# Patient Record
Sex: Female | Born: 1975 | Race: White | Hispanic: No | Marital: Married | Smoking: Former smoker
Health system: Southern US, Community
[De-identification: ages and names within clinical notes are randomized; demographics above are authoritative.]

## PROBLEM LIST (undated history)

## (undated) DIAGNOSIS — E119 Type 2 diabetes mellitus without complications: Secondary | ICD-10-CM

## (undated) DIAGNOSIS — F419 Anxiety disorder, unspecified: Secondary | ICD-10-CM

## (undated) DIAGNOSIS — E669 Obesity, unspecified: Secondary | ICD-10-CM

## (undated) DIAGNOSIS — I499 Cardiac arrhythmia, unspecified: Secondary | ICD-10-CM

## (undated) DIAGNOSIS — I10 Essential (primary) hypertension: Secondary | ICD-10-CM

## (undated) HISTORY — PX: CHOLECYSTECTOMY: SHX55

---

## 2010-05-26 ENCOUNTER — Ambulatory Visit: Payer: Self-pay | Admitting: Family Medicine

## 2011-04-20 ENCOUNTER — Ambulatory Visit: Payer: Self-pay | Admitting: Internal Medicine

## 2011-04-27 ENCOUNTER — Ambulatory Visit: Payer: Self-pay | Admitting: Internal Medicine

## 2011-12-21 ENCOUNTER — Emergency Department: Payer: Self-pay | Admitting: Emergency Medicine

## 2011-12-21 ENCOUNTER — Ambulatory Visit: Payer: Self-pay | Admitting: Family Medicine

## 2011-12-21 LAB — TROPONIN I: Troponin-I: <0.02 ng/mL

## 2011-12-21 LAB — CK-MB: CK-MB: <0.5 ng/mL — ABNORMAL LOW (ref 0.5–3.6)

## 2011-12-21 LAB — CK: CK, Total: 77 U/L (ref 21–215)

## 2011-12-22 LAB — URINALYSIS, COMPLETE
Bacteria: NONE SEEN
Bilirubin,UR: NEGATIVE
Blood: NEGATIVE
Glucose,UR: NEGATIVE mg/dL (ref 0–75)
Hyaline Cast: 1
Ketone: NEGATIVE
Nitrite: NEGATIVE
Ph: 5 (ref 4.5–8.0)
Protein: NEGATIVE
RBC,UR: <1 /HPF (ref 0–5)
Specific Gravity: 1.026 (ref 1.003–1.030)
Squamous Epithelial: 4
WBC UR: <1 /HPF (ref 0–5)

## 2011-12-22 LAB — COMPREHENSIVE METABOLIC PANEL
Albumin: 3.6 g/dL (ref 3.4–5.0)
Alkaline Phosphatase: 85 U/L (ref 50–136)
Anion Gap: 12 (ref 7–16)
BUN: 20 mg/dL — ABNORMAL HIGH (ref 7–18)
Bilirubin,Total: 0.5 mg/dL (ref 0.2–1.0)
Calcium, Total: 8.7 mg/dL (ref 8.5–10.1)
Chloride: 100 mmol/L (ref 98–107)
Co2: 27 mmol/L (ref 21–32)
Creatinine: 1.05 mg/dL (ref 0.60–1.30)
EGFR (African American): >60
EGFR (Non-African Amer.): >60
Glucose: 109 mg/dL — ABNORMAL HIGH (ref 65–99)
Osmolality: 281 (ref 275–301)
Potassium: 3.2 mmol/L — ABNORMAL LOW (ref 3.5–5.1)
SGOT(AST): 20 U/L (ref 15–37)
SGPT (ALT): 19 U/L
Sodium: 139 mmol/L (ref 136–145)
Total Protein: 7.9 g/dL (ref 6.4–8.2)

## 2011-12-22 LAB — PREGNANCY, URINE: Pregnancy Test, Urine: NEGATIVE m[IU]/mL

## 2011-12-22 LAB — CBC
HCT: 35.9 % (ref 35.0–47.0)
HGB: 12.2 g/dL (ref 12.0–16.0)
MCH: 30.1 pg (ref 26.0–34.0)
MCHC: 34.1 g/dL (ref 32.0–36.0)
MCV: 88 fL (ref 80–100)
Platelet: 311 10*3/uL (ref 150–440)
RBC: 4.07 10*6/uL (ref 3.80–5.20)
RDW: 14 % (ref 11.5–14.5)
WBC: 9.2 10*3/uL (ref 3.6–11.0)

## 2011-12-22 LAB — HCG, QUANTITATIVE, PREGNANCY: Beta Hcg, Quant.: <1 m[IU]/mL — ABNORMAL LOW

## 2012-07-11 ENCOUNTER — Ambulatory Visit: Payer: Self-pay | Admitting: Internal Medicine

## 2013-04-09 ENCOUNTER — Ambulatory Visit: Payer: Self-pay | Admitting: Family Medicine

## 2014-01-16 ENCOUNTER — Ambulatory Visit: Payer: Self-pay | Admitting: Nurse Practitioner

## 2015-01-29 ENCOUNTER — Other Ambulatory Visit: Payer: Self-pay | Admitting: Nurse Practitioner

## 2015-01-29 DIAGNOSIS — R1906 Epigastric swelling, mass or lump: Secondary | ICD-10-CM

## 2015-01-30 ENCOUNTER — Other Ambulatory Visit: Payer: Self-pay | Admitting: Nurse Practitioner

## 2015-02-02 ENCOUNTER — Ambulatory Visit
Admission: RE | Admit: 2015-02-02 | Discharge: 2015-02-02 | Disposition: A | Discharge status: Home / Self Care | Payer: BLUE CROSS/BLUE SHIELD | Source: Ambulatory Visit | Attending: Nurse Practitioner | Admitting: Nurse Practitioner

## 2015-02-02 DIAGNOSIS — R1906 Epigastric swelling, mass or lump: Secondary | ICD-10-CM | POA: Diagnosis not present

## 2015-02-18 ENCOUNTER — Ambulatory Visit
Admission: EM | Admit: 2015-02-18 | Discharge: 2015-02-18 | Disposition: A | Discharge status: Home / Self Care | Payer: BLUE CROSS/BLUE SHIELD | Attending: Family Medicine | Admitting: Family Medicine

## 2015-02-18 ENCOUNTER — Encounter: Payer: Self-pay | Admitting: Gynecology

## 2015-02-18 DIAGNOSIS — J069 Acute upper respiratory infection, unspecified: Secondary | ICD-10-CM

## 2015-02-18 DIAGNOSIS — J029 Acute pharyngitis, unspecified: Secondary | ICD-10-CM | POA: Diagnosis not present

## 2015-02-18 HISTORY — DX: Essential (primary) hypertension: I10

## 2015-02-18 HISTORY — DX: Anxiety disorder, unspecified: F41.9

## 2015-02-18 LAB — RAPID STREP SCREEN (MED CTR MEBANE ONLY): Streptococcus, Group A Screen (Direct): NEGATIVE

## 2015-02-18 MED ORDER — AZITHROMYCIN 250 MG PO TABS: ORAL_TABLET | ORAL | Status: DC

## 2015-02-18 NOTE — ED Provider Notes (Signed)
Patient presents with symptoms of sore throat, productive cough, nasal congestion for the last 3 days. Patient admits to low-grade fever. She denies any chest pain, shortness of breath, severe headache, nausea, vomiting, diarrhea, abdominal pain. She has been taking ibuprofen for her symptoms. She states that she works with a lot of people at [employer] that could be sick.  ROS: Negative except mentioned above. Vitals as per Epic.   GENERAL: NAD HEENT: mild to moderate pharyngeal erythema, no exudate, no erythema of TMs, mild cervical LAD RESP: CTA B CARD: RRR NEURO: CN II-XII grossly intact   A/P: Pharyngitis/URI- Z-pk, Delsym, Claritin, Tylenol/Motrin when necessary, rest, hydration, seek medical attention if symptoms persist or worsen.   Jolene ProvostKirtida Sonya Pucci, MD 02/18/15 1323

## 2015-02-18 NOTE — ED Notes (Signed)
Patient c/o sore throat / sinus infection. Per pt. X 3 days painful to swallow/ ear pain / facial congestion.

## 2015-02-18 NOTE — ED Notes (Signed)
Pt given verbal instructions for discharge via Dr.Patel. Pt was ambulatory leaving the clinic

## 2015-02-21 LAB — CULTURE, GROUP A STREP (THRC)

## 2015-05-01 ENCOUNTER — Emergency Department
Admission: EM | Admit: 2015-05-01 | Discharge: 2015-05-01 | Disposition: A | Discharge status: Home / Self Care | Payer: BLUE CROSS/BLUE SHIELD | Attending: Emergency Medicine | Admitting: Emergency Medicine

## 2015-05-01 ENCOUNTER — Emergency Department: Payer: BLUE CROSS/BLUE SHIELD

## 2015-05-01 DIAGNOSIS — Z3202 Encounter for pregnancy test, result negative: Secondary | ICD-10-CM | POA: Insufficient documentation

## 2015-05-01 DIAGNOSIS — Z79899 Other long term (current) drug therapy: Secondary | ICD-10-CM | POA: Insufficient documentation

## 2015-05-01 DIAGNOSIS — Z792 Long term (current) use of antibiotics: Secondary | ICD-10-CM | POA: Diagnosis not present

## 2015-05-01 DIAGNOSIS — I10 Essential (primary) hypertension: Secondary | ICD-10-CM | POA: Diagnosis not present

## 2015-05-01 DIAGNOSIS — M25551 Pain in right hip: Secondary | ICD-10-CM | POA: Diagnosis not present

## 2015-05-01 LAB — POC URINE PREG, ED: Urine-Other: NEGATIVE

## 2015-05-01 MED ORDER — KETOROLAC TROMETHAMINE 60 MG/2ML IM SOLN
60.0000 mg | Freq: Once | INTRAMUSCULAR | Status: AC
  Administered 2015-05-01: 60 mg via INTRAMUSCULAR

## 2015-05-01 MED ORDER — OXYCODONE-ACETAMINOPHEN 5-325 MG PO TABS: 1.0000 | ORAL_TABLET | ORAL | Status: DC | PRN

## 2015-05-01 MED ORDER — OXYCODONE-ACETAMINOPHEN 5-325 MG PO TABS
ORAL_TABLET | ORAL | Status: AC
  Administered 2015-05-01: 2 via ORAL
  Filled 2015-05-01: qty 1

## 2015-05-01 MED ORDER — OXYCODONE-ACETAMINOPHEN 5-325 MG PO TABS
ORAL_TABLET | ORAL | Status: AC
  Filled 2015-05-01: qty 1

## 2015-05-01 MED ORDER — OXYCODONE-ACETAMINOPHEN 5-325 MG PO TABS
2.0000 | ORAL_TABLET | Freq: Once | ORAL | Status: AC
  Administered 2015-05-01: 2 via ORAL

## 2015-05-01 MED ORDER — KETOROLAC TROMETHAMINE 60 MG/2ML IM SOLN
INTRAMUSCULAR | Status: AC
Start: 2015-05-01 — End: 2015-05-01
  Administered 2015-05-01: 60 mg via INTRAMUSCULAR
  Filled 2015-05-01: qty 2

## 2015-05-01 NOTE — Discharge Instructions (Signed)
Hip Pain Your hip is the joint between your upper legs and your lower pelvis. The bones, cartilage, tendons, and muscles of your hip joint perform a lot of work each day supporting your body weight and allowing you to move around. Hip pain can range from a minor ache to severe pain in one or both of your hips. Pain may be felt on the inside of the hip joint near the groin, or the outside near the buttocks and upper thigh. You may have swelling or stiffness as well.  HOME CARE INSTRUCTIONS   Take medicines only as directed by your health care provider.  Apply ice to the injured area:  Put ice in a plastic bag.  Place a towel between your skin and the bag.  Leave the ice on for 15-20 minutes at a time, 3-4 times a day.  Keep your leg raised (elevated) when possible to lessen swelling.  Avoid activities that cause pain.  Follow specific exercises as directed by your health care provider.  Sleep with a pillow between your legs on your most comfortable side.  Record how often you have hip pain, the location of the pain, and what it feels like. SEEK MEDICAL CARE IF:   You are unable to put weight on your leg.  Your hip is red or swollen or very tender to touch.  Your pain or swelling continues or worsens after 1 week.  You have increasing difficulty walking.  You have a fever. SEEK IMMEDIATE MEDICAL CARE IF:   You have fallen.  You have a sudden increase in pain and swelling in your hip. MAKE SURE YOU:   Understand these instructions.  Will watch your condition.  Will get help right away if you are not doing well or get worse. Document Released: 01/19/2010 Document Revised: 12/16/2013 Document Reviewed: 03/28/2013 Exit[employer] Patient Information 2015 [employer]. This information is not intended to replace advice given to you by your health care provider. Make sure you discuss any questions you have with your health care provider.  

## 2015-05-01 NOTE — ED Notes (Signed)
Pt to ED c/o R hip pain.

## 2015-05-01 NOTE — ED Provider Notes (Signed)
[facility] Emergency Department Provider Note  ____________________________________________  Time seen: 4:15 AM  I have reviewed the triage vital signs and the nursing notes.   HISTORY  Chief Complaint Hip Pain      HPI Lindsey Peterson is a 39 y.o. female presents with complaint of nontraumatic right hip pain. Patient states that she spent a lot of time yesterday going up and down a ladder and started experiencing right hip pain last night. It is worse with ambulation    Past Medical History  Diagnosis Date  . Hypertension   . Anxiety     There are no active problems to display for this patient.   Past Surgical History  Procedure Laterality Date  . Cesarean section    . Cholecystectomy      Current Outpatient Rx  Name  Route  Sig  Dispense  Refill  . azithromycin (ZITHROMAX Z-PAK) 250 MG tablet      Use as directed for 5 days.   6 each   0   . hydrochlorothiazide (HYDRODIURIL) 50 MG tablet   Oral   Take 50 mg by mouth daily.         Marland Kitchen oxyCODONE-acetaminophen (PERCOCET/ROXICET) 5-325 MG per tablet   Oral   Take 1 tablet by mouth every 4 (four) hours as needed for severe pain.   15 tablet   0   . PARoxetine (PAXIL) 40 MG tablet   Oral   Take 40 mg by mouth every morning.         . Vitamin D, Cholecalciferol, 1000 UNITS TABS   Oral   Take by mouth 2 (two) times daily.           Allergies Sulfa antibiotics  No family history on file.  Social History Social History  Substance Use Topics  . Smoking status: Never Smoker   . Smokeless tobacco: Not on file  . Alcohol Use: No    Review of Systems  Constitutional: Negative for fever. Eyes: Negative for visual changes. ENT: Negative for sore throat. Cardiovascular: Negative for chest pain. Respiratory: Negative for shortness of breath. Gastrointestinal: Negative for abdominal pain, vomiting and diarrhea. Genitourinary: Negative for dysuria. Musculoskeletal:  Positive for right hip pain Skin: Negative for rash. Neurological: Negative for headaches, focal weakness or numbness.   10-point ROS otherwise negative.  ____________________________________________   PHYSICAL EXAM:  VITAL SIGNS: ED Triage Vitals  Enc Vitals Group     BP 05/01/15 0349 171/99 mmHg     Pulse Rate 05/01/15 0349 98     Resp 05/01/15 0349 18     Temp 05/01/15 0349 98.4 F (36.9 C)     Temp Source 05/01/15 0349 Oral     SpO2 05/01/15 0349 95 %     Weight 05/01/15 0349 340 lb (154.223 kg)     Height 05/01/15 0349 5\' 7"  (1.702 m)     Head Cir --      Peak Flow --      Pain Score 05/01/15 0350 10     Pain Loc --      Pain Edu? --      Excl. in GC? --      Constitutional: Alert and oriented. Well appearing and in no distress. Eyes: Conjunctivae are normal. PERRL. Normal extraocular movements. ENT   Head: Normocephalic and atraumatic.   Nose: No congestion/rhinnorhea.   Mouth/Throat: Mucous membranes are moist.   Neck: No stridor. Hematological/Lymphatic/Immunilogical: No cervical lymphadenopathy. Cardiovascular: Normal rate, regular rhythm. Normal  and symmetric distal pulses are present in all extremities. No murmurs, rubs, or gallops. Respiratory: Normal respiratory effort without tachypnea nor retractions. Breath sounds are clear and equal bilaterally. No wheezes/rales/rhonchi. Gastrointestinal: Soft and nontender. No distention. There is no CVA tenderness. Genitourinary: deferred Musculoskeletal: Tenderness to palpation of the right hip.. No joint effusions.  No lower extremity tenderness nor edema. Neurologic:  Normal speech and language. No gross focal neurologic deficits are appreciated. Speech is normal.  Skin:  Skin is warm, dry and intact. No rash noted. Psychiatric: Mood and affect are normal. Speech and behavior are normal. Patient exhibits appropriate insight and judgment.  ____________________________________________    LABS  (pertinent positives/negatives)  Labs Reviewed  POC URINE PREG, ED       RADIOLOGY     DG HIP UNILAT WITH PELVIS 2-3 VIEWS RIGHT (Final result) Result time: 05/01/15 05:39:13   Final result by Rad Results In Interface (05/01/15 05:39:13)   Narrative:   CLINICAL DATA: Right hip pain for 1 day. No known injury.  EXAM: DG HIP (WITH OR WITHOUT PELVIS) 2-3V RIGHT  COMPARISON: None.  FINDINGS: The cortical margins of the bony pelvis and right hip are intact. No fracture. Pubic symphysis and sacroiliac joints are congruent. Both femoral heads are well-seated in the respective acetabula. Small subcortical cyst at the right femoral head neck junction, likely a synovial herniation pit of no clinical significance. Intrauterine device in the pelvis. Detailed evaluation limited by soft tissue attenuation from obese body habitus.  IMPRESSION: No acute bony abnormality.   Electronically Signed By: Rubye Oaks M.D. On: 05/01/2015 05:39         INITIAL IMPRESSION / ASSESSMENT AND PLAN / ED COURSE  Pertinent labs & imaging results that were available during my care of the patient were reviewed by me and considered in my medical decision making (see chart for details).  Patient received IM Toradol and Percocet with improvement of pain. Patient advised to follow-up with orthopedic surgeon on call.  ____________________________________________   FINAL CLINICAL IMPRESSION(S) / ED DIAGNOSES  Final diagnoses:  Right hip pain      Darci Current, MD 05/05/15 [id number]

## 2015-05-01 NOTE — ED Notes (Signed)
POC PREGNANCY TEST NEGATIVE 

## 2015-06-01 ENCOUNTER — Encounter: Payer: Self-pay | Admitting: Emergency Medicine

## 2015-06-01 ENCOUNTER — Ambulatory Visit: Payer: BLUE CROSS/BLUE SHIELD

## 2015-06-01 ENCOUNTER — Ambulatory Visit
Admission: EM | Admit: 2015-06-01 | Discharge: 2015-06-01 | Disposition: A | Discharge status: Home / Self Care | Payer: BLUE CROSS/BLUE SHIELD | Attending: Family Medicine | Admitting: Family Medicine

## 2015-06-01 DIAGNOSIS — F419 Anxiety disorder, unspecified: Secondary | ICD-10-CM | POA: Diagnosis not present

## 2015-06-01 DIAGNOSIS — S29011A Strain of muscle and tendon of front wall of thorax, initial encounter: Secondary | ICD-10-CM | POA: Diagnosis not present

## 2015-06-01 DIAGNOSIS — Z87891 Personal history of nicotine dependence: Secondary | ICD-10-CM | POA: Diagnosis not present

## 2015-06-01 DIAGNOSIS — I10 Essential (primary) hypertension: Secondary | ICD-10-CM | POA: Insufficient documentation

## 2015-06-01 DIAGNOSIS — X58XXXA Exposure to other specified factors, initial encounter: Secondary | ICD-10-CM | POA: Insufficient documentation

## 2015-06-01 DIAGNOSIS — R0781 Pleurodynia: Secondary | ICD-10-CM | POA: Insufficient documentation

## 2015-06-01 DIAGNOSIS — M549 Dorsalgia, unspecified: Secondary | ICD-10-CM | POA: Diagnosis present

## 2015-06-01 MED ORDER — OXYCODONE-ACETAMINOPHEN 7.5-325 MG PO TABS: 1.0000 | ORAL_TABLET | ORAL | Status: DC | PRN

## 2015-06-01 MED ORDER — KETOROLAC TROMETHAMINE 60 MG/2ML IM SOLN
60.0000 mg | Freq: Once | INTRAMUSCULAR | Status: AC
  Administered 2015-06-01: 60 mg via INTRAMUSCULAR

## 2015-06-01 MED ORDER — ORPHENADRINE CITRATE ER 100 MG PO TB12: 100.0000 mg | ORAL_TABLET | Freq: Two times a day (BID) | ORAL | Status: DC

## 2015-06-01 NOTE — ED Provider Notes (Signed)
CSN: [id number]     Arrival date & time 06/01/15  0815 History   First MD Initiated Contact with Patient 06/01/15 [phone]    Nurses notes were reviewed. Chief Complaint  Patient presents with  . Back Pain   patient reports having left-sided back pain and side pain started on Friday. She states the pain actually did radiate down her left arm at times the pain does occur at rest. She is asked taking meloxicam now 15 mg for hip arthritis and talk to her supervisor who had pleurisy before. While she's never had pneumonia or any other lung problems thought that she probably should come in to be evaluated. (Consider location/radiation/quality/duration/timing/severity/associated sxs/prior Treatment) Patient is a 39 y.o. female presenting with back pain and chest pain. The history is provided by the patient. No language interpreter was used.  Back Pain Location:  Thoracic spine Quality:  Aching and stabbing Radiates to:  L shoulder Pain severity:  Moderate Pain is:  Unable to specify Onset quality: Started on Friday. Timing:  Intermittent Progression:  Worsening Chronicity:  New Context: recent injury and twisting   Context: not emotional stress, not falling, not jumping from heights, not lifting heavy objects, not MCA and not occupational injury   Relieved by:  Nothing Ineffective treatments:  NSAIDs (Meloxicam 15 mg) Associated symptoms: chest pain and tingling   Associated symptoms: no weight loss   Risk factors: steroid use   Chest Pain Pain location:  L lateral chest Pain quality: aching, sharp and shooting   Pain radiates to:  L arm and L shoulder Pain radiates to the back: yes   Pain severity:  Moderate Duration:  4 days Timing:  Intermittent Progression:  Worsening Context: breathing, lifting, movement and at rest   Context: no stress and no trauma   Relieved by:  Nothing Ineffective treatments: Mobic 15mg . Associated symptoms: back pain   Associated symptoms: no shortness  of breath   Risk factors: obesity and smoking   Risk factors: no coronary artery disease and no diabetes mellitus   Risk factors comment:  Patient is a former smoker   Past Medical History  Diagnosis Date  . Hypertension   . Anxiety    Past Surgical History  Procedure Laterality Date  . Cesarean section    . Cholecystectomy     History reviewed. No pertinent family history. Social History  Substance Use Topics  . Smoking status: Former Games developermoker  . Smokeless tobacco: None  . Alcohol Use: No   OB History    No data available     Review of Systems  Constitutional: Positive for activity change. Negative for weight loss and appetite change.  Respiratory: Negative for shortness of breath and wheezing.   Cardiovascular: Positive for chest pain.  Musculoskeletal: Positive for myalgias and back pain.       States that she is being treated for arthritis of the right hip with meloxicam and meloxicam was more effective than the Percocet they initially placed her on in the ED.  Neurological: Positive for tingling.  All other systems reviewed and are negative.   Allergies  Sulfa antibiotics  Home Medications   Prior to Admission medications   Medication Sig Start Date End Date Taking? Authorizing Provider  levonorgestrel (MIRENA, 52 MG,) 20 MCG/24HR IUD 1 each by Intrauterine route once.   Yes Historical Provider, MD  meloxicam (MOBIC) 15 MG tablet Take 15 mg by mouth daily.   Yes Historical Provider, MD  azithromycin (ZITHROMAX Z-PAK) 250  MG tablet Use as directed for 5 days. 02/18/15   Jolene Provost, MD  hydrochlorothiazide (HYDRODIURIL) 50 MG tablet Take 50 mg by mouth daily.    Historical Provider, MD  orphenadrine (NORFLEX) 100 MG tablet Take 1 tablet (100 mg total) by mouth 2 (two) times daily. 06/01/15   Hassan Rowan, MD  oxyCODONE-acetaminophen (PERCOCET) 7.5-325 MG tablet Take 1 tablet by mouth every 4 (four) hours as needed for severe pain. 06/01/15   Hassan Rowan, MD    oxyCODONE-acetaminophen (PERCOCET/ROXICET) 5-325 MG per tablet Take 1 tablet by mouth every 4 (four) hours as needed for severe pain. 05/01/15   Darci Current, MD  PARoxetine (PAXIL) 40 MG tablet Take 40 mg by mouth every morning.    Historical Provider, MD  Vitamin D, Cholecalciferol, 1000 UNITS TABS Take by mouth 2 (two) times daily.    Historical Provider, MD   Meds Ordered and Administered this Visit  Nurses notes were reviewed. Medications  ketorolac (TORADOL) injection 60 mg (60 mg Intramuscular Given 06/01/15 0908)    BP 128/84 mmHg  Pulse 81  Temp(Src) 97.6 F (36.4 C) (Tympanic)  Resp 16  Ht  (1.702 m)  Wt 340 lb (154.223 kg)  BMI 53.24 kg/m2  SpO2 98%  LMP 05/16/2015 No data found.   Physical Exam  Constitutional: She is oriented to person, place, and time. She appears well-developed and well-nourished.  HENT:  Head: Normocephalic and atraumatic.  Eyes: Conjunctivae are normal. Pupils are equal, round, and reactive to light.  Neck: Normal range of motion. Neck supple.  Cardiovascular: Normal rate and regular rhythm.   Pulmonary/Chest: Effort normal and breath sounds normal. No respiratory distress. She has no wheezes.  Musculoskeletal: Normal range of motion. She exhibits tenderness.       Cervical back: She exhibits tenderness.       Thoracic back: She exhibits tenderness and bony tenderness.       Back:       Arms: Neurological: She is alert and oriented to person, place, and time.  Skin: Skin is warm. No rash noted. No erythema.  Psychiatric: She has a normal mood and affect.  Vitals reviewed.   ED Course  Procedures (including critical care time)  Labs Review Labs Reviewed - No data to display  Imaging Review Dg Ribs Unilateral W/chest Left  06/01/2015  CLINICAL DATA:  Anterior left mid rib pain 3 days prior. No reported injury . EXAM: LEFT RIBS AND CHEST - 3+ VIEW COMPARISON:  12/21/2011 chest radiograph. FINDINGS: Normal heart size. Normal  mediastinal contour. No pneumothorax. No pleural effusion. Clear lungs, with no focal lung consolidation and no pulmonary edema. No fracture or suspicious focal osseous lesion is seen in the left ribs. Cholecystectomy clips are noted in the right upper quadrant of the abdomen. IMPRESSION: No active disease in the chest. No fracture or suspicious focal osseous lesion is seen in the left ribs. Electronically Signed   By: Delbert Phenix M.D.   On: 06/01/2015 09:37     Visual Acuity Review  Right Eye Distance:   Left Eye Distance:   Bilateral Distance:    Right Eye Near:   Left Eye Near:    Bilateral Near:     ED ECG REPORT   Date: 06/01/2015  EKG Time: 11:11 AM  Rate:  Rhythm: normal sinus rhythm,  normal EKG, normal sinus rhythm, unchanged from previous tracings  Axis: 60  Intervals:none  ST&T Change: none  Narrative Interpretation: normal sinus rhythm  MDM   1. Muscle strain of chest wall, initial encounter   2. Rib pain on left side      While able to reproduce the pain and discomfort over the left axillary ribs and some palpation of the ribs in the back concern is because of her size and fact that she's used Mobic already and hasn't helped and has no history of trauma will obtain a chest x-ray and EKG those things are normal as expected we'll administer 60 Toradol IM and a muscle relaxer like on Norflex and may consider adding and narcotic pain medication as well.  Patient reported no improvement with Toradol injection. She is ready on meloxicam 15 mg at home. Percocet 5 were ineffective when she had a hip pain. We'll place on Percocet 7.5 #20, Norflex 100 mg twice a day and work note for today and tomorrow. If not improved please see her PCP in about a week.  Hassan Rowan, MD 06/01/15 [phone]

## 2015-06-01 NOTE — Discharge Instructions (Signed)
Costochondritis Costochondritis is a condition in which the tissue (cartilage) that connects your ribs with your breastbone (sternum) becomes irritated. It causes pain in the chest and rib area. It usually goes away on its own over time. HOME CARE  Avoid activities that wear you out.  Do not strain your ribs. Avoid activities that use your:  Chest.  Belly.  Side muscles.  Put ice on the area for the first 2 days after the pain starts.  Put ice in a plastic bag.  Place a towel between your skin and the bag.  Leave the ice on for 20 minutes, 2-3 times a day.  Only take medicine as told by your doctor. GET HELP IF:  You have redness or puffiness (swelling) in the rib area.  Your pain does not go away with rest or medicine. GET HELP RIGHT AWAY IF:   Your pain gets worse.  You are very uncomfortable.  You have trouble breathing.  You cough up blood.  You start sweating or throwing up (vomiting).  You have a fever or lasting symptoms for more than 2-3 days.  You have a fever and your symptoms suddenly get worse. MAKE SURE YOU:   Understand these instructions.  Will watch your condition.  Will get help right away if you are not doing well or get worse.   This information is not intended to replace advice given to you by your health care provider. Make sure you discuss any questions you have with your health care provider.   Document Released: 01/18/2008 Document Revised: 04/03/2013 Document Reviewed: 03/05/2013 Elsevier Interactive Patient Education 2016 [employer]  Muscle Strain A muscle strain (pulled muscle) happens when a muscle is stretched beyond normal length. It happens when a sudden, violent force stretches your muscle too far. Usually, a few of the fibers in your muscle are torn. Muscle strain is common in athletes. Recovery usually takes 1-2 weeks. Complete healing takes 5-6 weeks.  HOME CARE   Follow the PRICE method of treatment to help your  injury get better. Do this the first 2-3 days after the injury:  Protect. Protect the muscle to keep it from getting injured again.  Rest. Limit your activity and rest the injured body part.  Ice. Put ice in a plastic bag. Place a towel between your skin and the bag. Then, apply the ice and leave it on from 15-20 minutes each hour. After the third day, switch to moist heat packs.  Compression. Use a splint or elastic bandage on the injured area for comfort. Do not put it on too tightly.  Elevate. Keep the injured body part above the level of your heart.  Only take medicine as told by your doctor.  Warm up before doing exercise to prevent future muscle strains. GET HELP IF:   You have more pain or puffiness (swelling) in the injured area.  You feel numbness, tingling, or notice a loss of strength in the injured area. MAKE SURE YOU:   Understand these instructions.  Will watch your condition.  Will get help right away if you are not doing well or get worse.   This information is not intended to replace advice given to you by your health care provider. Make sure you discuss any questions you have with your health care provider.   Document Released: 05/10/2008 Document Revised: 05/22/2013 Document Reviewed: 02/28/2013 Elsevier Interactive Patient Education [employer]

## 2015-06-01 NOTE — ED Notes (Signed)
Patient c/o pain in her left upper back for 3 days.  Patient reports increase pain with movement and when taking a deep breath.

## 2015-08-17 ENCOUNTER — Ambulatory Visit
Admission: EM | Admit: 2015-08-17 | Discharge: 2015-08-17 | Disposition: A | Discharge status: Home / Self Care | Payer: BLUE CROSS/BLUE SHIELD | Attending: Family Medicine | Admitting: Family Medicine

## 2015-08-17 DIAGNOSIS — R600 Localized edema: Secondary | ICD-10-CM | POA: Diagnosis not present

## 2015-08-17 DIAGNOSIS — F419 Anxiety disorder, unspecified: Secondary | ICD-10-CM | POA: Diagnosis not present

## 2015-08-17 LAB — BASIC METABOLIC PANEL
Anion gap: 7 (ref 5–15)
BUN: 20 mg/dL (ref 6–20)
CO2: 25 mmol/L (ref 22–32)
Calcium: 9.3 mg/dL (ref 8.9–10.3)
Chloride: 104 mmol/L (ref 101–111)
Creatinine, Ser: 0.76 mg/dL (ref 0.44–1.00)
GFR calc Af Amer: >60 mL/min (ref >60)
GFR calc non Af Amer: >60 mL/min (ref >60)
Glucose, Bld: 99 mg/dL (ref 65–99)
Potassium: 3.6 mmol/L (ref 3.5–5.1)
Sodium: 136 mmol/L (ref 135–145)

## 2015-08-17 MED ORDER — HYDROCHLOROTHIAZIDE 50 MG PO TABS: 50.0000 mg | ORAL_TABLET | Freq: Every day | ORAL | Status: AC

## 2015-08-17 MED ORDER — PAROXETINE HCL 40 MG PO TABS: 40.0000 mg | ORAL_TABLET | ORAL | Status: DC

## 2015-08-17 NOTE — ED Notes (Signed)
Patient states that she does not have a primary care MD and she has been trying to get in with one. Patient states that she is on HCTZ and has been off of medication for the last 4 days. She states that she is swollen in hands and feet and this is causing numbness in her hands.

## 2015-08-17 NOTE — ED Provider Notes (Signed)
CSN: [id number]     Arrival date & time 08/17/15  1522 History   First MD Initiated Contact with Patient 08/17/15 1814     Chief Complaint  Patient presents with  . Numbness   (Consider location/radiation/quality/duration/timing/severity/associated sxs/prior Treatment) HPI Comments: 40 yo female with a h/o hypertension and anxiety complains of 4 days of  hand and feet swelling since running out of her hydrochlorothiazide which she's states she's prescribed by her gynecologist for her edema. States has had similar symptoms in the past when has been out of her HCTZ. Patient requesting a refill on her HCTZ and her Paxil. States both of these medicines have been prescribed by her gynecologist for the last 6 months and was told by gynecologist that she needed to find a PCP to prescribe this. Patient has not done this and now has run out of her HCTZ and per patient her gynecologist will not refill anymore.   The history is provided by the patient.    Past Medical History  Diagnosis Date  . Hypertension   . Anxiety    Past Surgical History  Procedure Laterality Date  . Cesarean section    . Cholecystectomy     History reviewed. No pertinent family history. Social History  Substance Use Topics  . Smoking status: Former Games developer  . Smokeless tobacco: None  . Alcohol Use: No   OB History    No data available     Review of Systems  Allergies  Sulfa antibiotics  Home Medications   Prior to Admission medications   Medication Sig Start Date End Date Taking? Authorizing Provider  levonorgestrel (MIRENA, 52 MG,) 20 MCG/24HR IUD 1 each by Intrauterine route once.   Yes Historical Provider, MD  meloxicam (MOBIC) 15 MG tablet Take 15 mg by mouth daily.   Yes Historical Provider, MD  orphenadrine (NORFLEX) 100 MG tablet Take 1 tablet (100 mg total) by mouth 2 (two) times daily. 06/01/15  Yes Hassan Rowan, MD  oxyCODONE-acetaminophen (PERCOCET) 7.5-325 MG tablet Take 1 tablet by mouth every 4  (four) hours as needed for severe pain. 06/01/15  Yes Hassan Rowan, MD  oxyCODONE-acetaminophen (PERCOCET/ROXICET) 5-325 MG per tablet Take 1 tablet by mouth every 4 (four) hours as needed for severe pain. 05/01/15  Yes Darci Current, MD  Vitamin D, Cholecalciferol, 1000 UNITS TABS Take by mouth 2 (two) times daily.   Yes Historical Provider, MD  azithromycin (ZITHROMAX Z-PAK) 250 MG tablet Use as directed for 5 days. 02/18/15   Jolene Provost, MD  hydrochlorothiazide (HYDRODIURIL) 50 MG tablet Take 1 tablet (50 mg total) by mouth daily. 08/17/15   Payton Mccallum, MD  PARoxetine (PAXIL) 40 MG tablet Take 1 tablet (40 mg total) by mouth every morning. 08/17/15   Payton Mccallum, MD   Meds Ordered and Administered this Visit  Medications - No data to display  BP 141/84 mmHg  Pulse 90  Temp(Src) 98.4 F (36.9 C) (Tympanic)  Resp 17  Ht 5\' 7"  (1.702 m)  Wt 340 lb (154.223 kg)  BMI 53.24 kg/m2  SpO2 100% No data found.   Physical Exam  Constitutional: She appears well-developed and well-nourished. No distress.  HENT:  Head: Normocephalic and atraumatic.  Right Ear: Tympanic membrane, external ear and ear canal normal.  Left Ear: Tympanic membrane, external ear and ear canal normal.  Nose: No mucosal edema, rhinorrhea, nose lacerations, sinus tenderness, nasal deformity, septal deviation or nasal septal hematoma. No epistaxis.  No foreign bodies.  Mouth/Throat: Uvula is  midline, oropharynx is clear and moist and mucous membranes are normal. No oropharyngeal exudate.  Eyes: Conjunctivae and EOM are normal. Pupils are equal, round, and reactive to light. Right eye exhibits no discharge. Left eye exhibits no discharge. No scleral icterus.  Neck: Normal range of motion. Neck supple. No thyromegaly present.  Cardiovascular: Normal rate, regular rhythm and normal heart sounds.   Pulmonary/Chest: Effort normal and breath sounds normal. No respiratory distress. She has no wheezes. She has no rales.   Musculoskeletal: She exhibits edema (mild hand and tibial/pedal edema).  Lymphadenopathy:    She has no cervical adenopathy.  Neurological: She is alert. She has normal reflexes. She displays no atrophy, no tremor and normal reflexes. No cranial nerve deficit. She exhibits normal muscle tone. Coordination normal.  Skin: She is not diaphoretic.  Nursing note and vitals reviewed.   ED Course  Procedures (including critical care time)  Labs Review Labs Reviewed  BASIC METABOLIC PANEL    Imaging Review No results found.   Visual Acuity Review  Right Eye Distance:   Left Eye Distance:   Bilateral Distance:    Right Eye Near:   Left Eye Near:    Bilateral Near:         MDM   1. Localized edema   2. Anxiety    Discharge Medication List as of 08/17/2015  7:39 PM     Discharge Medication List as of 08/17/2015  7:39 PM     Discharge Medication List as of 08/17/2015  7:39 PM    1. Lab results and diagnosis reviewed with patient 2. rx as per orders above; reviewed possible side effects, interactions, risks and benefits; patient given one week's worth of medications and instructed to find/get set up with PCP for management of her chronic conditions 3. Follow-up prn   Payton Mccallumrlando Jamieson Hetland, MD 09/09/15 1243

## 2017-04-13 ENCOUNTER — Other Ambulatory Visit: Payer: Self-pay | Admitting: Obstetrics & Gynecology

## 2017-04-13 DIAGNOSIS — Z1231 Encounter for screening mammogram for malignant neoplasm of breast: Secondary | ICD-10-CM

## 2017-05-02 ENCOUNTER — Ambulatory Visit
Admission: RE | Admit: 2017-05-02 | Discharge: 2017-05-02 | Disposition: A | Discharge status: Home / Self Care | Payer: 59 | Source: Ambulatory Visit | Attending: Obstetrics & Gynecology | Admitting: Obstetrics & Gynecology

## 2017-05-02 DIAGNOSIS — Z1231 Encounter for screening mammogram for malignant neoplasm of breast: Secondary | ICD-10-CM | POA: Diagnosis present

## 2017-05-12 ENCOUNTER — Other Ambulatory Visit: Payer: Self-pay | Admitting: *Deleted

## 2017-05-12 ENCOUNTER — Inpatient Hospital Stay
Admission: RE | Admit: 2017-05-12 | Discharge: 2017-05-12 | Disposition: A | Discharge status: Home / Self Care | Payer: Self-pay | Source: Ambulatory Visit | Attending: *Deleted | Admitting: *Deleted

## 2017-05-12 DIAGNOSIS — Z9289 Personal history of other medical treatment: Secondary | ICD-10-CM

## 2019-09-06 ENCOUNTER — Emergency Department
Admission: EM | Admit: 2019-09-06 | Discharge: 2019-09-06 | Disposition: A | Discharge status: Home / Self Care | Payer: BC Managed Care – PPO | Attending: Emergency Medicine | Admitting: Emergency Medicine

## 2019-09-06 ENCOUNTER — Encounter: Payer: Self-pay | Admitting: Emergency Medicine

## 2019-09-06 ENCOUNTER — Other Ambulatory Visit: Payer: Self-pay

## 2019-09-06 DIAGNOSIS — I10 Essential (primary) hypertension: Secondary | ICD-10-CM | POA: Insufficient documentation

## 2019-09-06 DIAGNOSIS — E119 Type 2 diabetes mellitus without complications: Secondary | ICD-10-CM | POA: Diagnosis not present

## 2019-09-06 DIAGNOSIS — Z79899 Other long term (current) drug therapy: Secondary | ICD-10-CM | POA: Insufficient documentation

## 2019-09-06 DIAGNOSIS — R002 Palpitations: Secondary | ICD-10-CM

## 2019-09-06 DIAGNOSIS — Z7984 Long term (current) use of oral hypoglycemic drugs: Secondary | ICD-10-CM | POA: Diagnosis not present

## 2019-09-06 DIAGNOSIS — R0602 Shortness of breath: Secondary | ICD-10-CM | POA: Insufficient documentation

## 2019-09-06 HISTORY — DX: Type 2 diabetes mellitus without complications: E11.9

## 2019-09-06 HISTORY — DX: Obesity, unspecified: E66.9

## 2019-09-06 LAB — TROPONIN I (HIGH SENSITIVITY): Troponin I (High Sensitivity): <2 ng/L (ref <18)

## 2019-09-06 LAB — CBC WITH DIFFERENTIAL/PLATELET
Abs Immature Granulocytes: 0.03 10*3/uL (ref 0.00–0.07)
Basophils Absolute: 0.1 10*3/uL (ref 0.0–0.1)
Basophils Relative: 1 %
Eosinophils Absolute: 0.3 10*3/uL (ref 0.0–0.5)
Eosinophils Relative: 3 %
HCT: 35.9 % — ABNORMAL LOW (ref 36.0–46.0)
Hemoglobin: 12 g/dL (ref 12.0–15.0)
Immature Granulocytes: 0 %
Lymphocytes Relative: 32 %
Lymphs Abs: 3.1 10*3/uL (ref 0.7–4.0)
MCH: 30.5 pg (ref 26.0–34.0)
MCHC: 33.4 g/dL (ref 30.0–36.0)
MCV: 91.1 fL (ref 80.0–100.0)
Monocytes Absolute: 0.6 10*3/uL (ref 0.1–1.0)
Monocytes Relative: 6 %
Neutro Abs: 5.8 10*3/uL (ref 1.7–7.7)
Neutrophils Relative %: 58 %
Platelets: 338 10*3/uL (ref 150–400)
RBC: 3.94 MIL/uL (ref 3.87–5.11)
RDW: 13.6 % (ref 11.5–15.5)
WBC: 9.9 10*3/uL (ref 4.0–10.5)
nRBC: 0 % (ref 0.0–0.2)

## 2019-09-06 LAB — COMPREHENSIVE METABOLIC PANEL
ALT: 18 U/L (ref 0–44)
AST: 19 U/L (ref 15–41)
Albumin: 4.3 g/dL (ref 3.5–5.0)
Alkaline Phosphatase: 70 U/L (ref 38–126)
Anion gap: 12 (ref 5–15)
BUN: 26 mg/dL — ABNORMAL HIGH (ref 6–20)
CO2: 24 mmol/L (ref 22–32)
Calcium: 9 mg/dL (ref 8.9–10.3)
Chloride: 103 mmol/L (ref 98–111)
Creatinine, Ser: 0.98 mg/dL (ref 0.44–1.00)
GFR calc Af Amer: >60 mL/min (ref >60)
GFR calc non Af Amer: >60 mL/min (ref >60)
Glucose, Bld: 116 mg/dL — ABNORMAL HIGH (ref 70–99)
Potassium: 3.7 mmol/L (ref 3.5–5.1)
Sodium: 139 mmol/L (ref 135–145)
Total Bilirubin: 0.8 mg/dL (ref 0.3–1.2)
Total Protein: 8 g/dL (ref 6.5–8.1)

## 2019-09-06 LAB — POCT PREGNANCY, URINE: Preg Test, Ur: NEGATIVE

## 2019-09-06 LAB — URINALYSIS, COMPLETE (UACMP) WITH MICROSCOPIC
Bacteria, UA: NONE SEEN
Bilirubin Urine: NEGATIVE
Glucose, UA: NEGATIVE mg/dL
Ketones, ur: NEGATIVE mg/dL
Leukocytes,Ua: NEGATIVE
Nitrite: NEGATIVE
Protein, ur: NEGATIVE mg/dL
Specific Gravity, Urine: 1.021 (ref 1.005–1.030)
pH: 5 (ref 5.0–8.0)

## 2019-09-06 NOTE — ED Provider Notes (Signed)
[facility] Emergency Department Provider Note  ____________________________________________   First MD Initiated Contact with Patient 09/06/19 [phone]     (approximate)  I have reviewed the triage vital signs and the nursing notes.   HISTORY  Chief Complaint Palpitations    HPI Lindsey Peterson is a 44 y.o. female with medical issues as listed below who presents for evaluation of acute onset and severe episode  where she felt palpitations, pressure in her head, and some mild shortness of breath.  She did not have any pain.  She has had palpitations in the past but nothing with this combination of symptoms and nothing this severe.  Lasted a few minutes, nothing in particular made it better or worse.  It occurred while she was driving.  She has no history of heart disease, blood clots in the legs or the lungs, and has had no recent trauma.  No history of stroke.        Past Medical History:  Diagnosis Date  . Anxiety   . Diabetes mellitus without complication (HCC)   . Hypertension   . Obesity     There are no problems to display for this patient.   Past Surgical History:  Procedure Laterality Date  . CESAREAN SECTION    . CHOLECYSTECTOMY      Prior to Admission medications   Medication Sig Start Date End Date Taking? Authorizing Provider  azithromycin (ZITHROMAX Z-PAK) 250 MG tablet Use as directed for 5 days. 02/18/15   Jolene Provost, MD  hydrochlorothiazide (HYDRODIURIL) 50 MG tablet Take 1 tablet (50 mg total) by mouth daily. 08/17/15   Payton Mccallum, MD  levonorgestrel (MIRENA, 52 MG,) 20 MCG/24HR IUD 1 each by Intrauterine route once.    [provider]  meloxicam (MOBIC) 15 MG tablet Take 15 mg by mouth daily.    [provider]  orphenadrine (NORFLEX) 100 MG tablet Take 1 tablet (100 mg total) by mouth 2 (two) times daily. 06/01/15   Hassan Rowan, MD  oxyCODONE-acetaminophen (PERCOCET) 7.5-325 MG tablet Take 1 tablet by  mouth every 4 (four) hours as needed for severe pain. 06/01/15   Hassan Rowan, MD  oxyCODONE-acetaminophen (PERCOCET/ROXICET) 5-325 MG per tablet Take 1 tablet by mouth every 4 (four) hours as needed for severe pain. 05/01/15   Darci Current, MD  PARoxetine (PAXIL) 40 MG tablet Take 1 tablet (40 mg total) by mouth every morning. 08/17/15   Payton Mccallum, MD  Vitamin D, Cholecalciferol, 1000 UNITS TABS Take by mouth 2 (two) times daily.    [provider]    Allergies Sulfa antibiotics  History reviewed. No pertinent family history.  Social History Social History   Tobacco Use  . Smoking status: Former Games developer  . Smokeless tobacco: Never Used  Substance Use Topics  . Alcohol use: No    Alcohol/week: 0.0 standard drinks  . Drug use: No    Review of Systems Constitutional: No fever/chills Eyes: No visual changes. ENT: No sore throat. Cardiovascular: Palpitations Respiratory: Denies shortness of breath. Gastrointestinal: No abdominal pain.  No nausea, no vomiting.  No diarrhea.  No constipation. Genitourinary: Negative for dysuria. Musculoskeletal: Negative for neck pain.  Negative for back pain. Integumentary: Negative for rash. Neurological: Pressure in her head.  Negative for headaches, focal weakness or numbness.   ____________________________________________   PHYSICAL EXAM:  VITAL SIGNS: ED Triage Vitals [09/06/19 0126]  Enc Vitals Group     BP (!) 141/77     Pulse Rate  88     Resp 18     Temp 98.4 F (36.9 C)     Temp Source Oral     SpO2      Weight (!) 142 kg (313 lb)     Height 1.702 m (5\' 7" )     Head Circumference      Peak Flow      Pain Score 0     Pain Loc      Pain Edu?      Excl. in GC?     Constitutional: Alert and oriented.  Well-appearing and in no acute distress. Eyes: Conjunctivae are normal.  Head: Atraumatic. Nose: No congestion/rhinnorhea. Mouth/Throat: Patient is wearing a mask. Neck: No stridor.  No meningeal signs.     Cardiovascular: Normal rate, regular rhythm. Good peripheral circulation. Grossly normal heart sounds. Respiratory: Normal respiratory effort.  No retractions. Gastrointestinal: Soft and nontender. No distention.  Musculoskeletal: No lower extremity tenderness nor edema. No gross deformities of extremities. Neurologic:  Normal speech and language. No gross focal neurologic deficits are appreciated.  Skin:  Skin is warm, dry and intact. Psychiatric: Mood and affect are normal. Speech and behavior are normal.  ____________________________________________   LABS (all labs ordered are listed, but only abnormal results are displayed)  Labs Reviewed  CBC WITH DIFFERENTIAL/PLATELET - Abnormal; Notable for the following components:      Result Value   HCT 35.9 (*)    All other components within normal limits  COMPREHENSIVE METABOLIC PANEL - Abnormal; Notable for the following components:   Glucose, Bld 116 (*)    BUN 26 (*)    All other components within normal limits  URINALYSIS, COMPLETE (UACMP) WITH MICROSCOPIC - Abnormal; Notable for the following components:   Color, Urine YELLOW (*)    APPearance CLEAR (*)    Hgb urine dipstick MODERATE (*)    All other components within normal limits  POCT PREGNANCY, URINE  TROPONIN I (HIGH SENSITIVITY)   ____________________________________________  EKG  ED ECG REPORT I, , the attending physician, personally viewed and interpreted this ECG.  Date: 09/06/2019 EKG Time: 1:33 AM Rate: 79 Rhythm: normal sinus rhythm QRS Axis: normal Intervals: normal ST/T Wave abnormalities: normal Narrative Interpretation: no evidence of acute ischemia  ____________________________________________  RADIOLOGY I, 09/08/2019, personally viewed and evaluated these images (plain radiographs) as part of my medical decision making, as well as reviewing the written report by the radiologist.  ED MD interpretation: No indication for emergent  imaging  Official radiology report(s): No results found.  ____________________________________________   PROCEDURES   Procedure(s) performed (including Critical Care):  Procedures   ____________________________________________   INITIAL IMPRESSION / MDM / ASSESSMENT AND PLAN / ED COURSE  As part of my medical decision making, I reviewed the following data within the electronic MEDICAL RECORD NUMBER Nursing notes reviewed and incorporated, Labs reviewed , EKG interpreted , Old chart reviewed and Notes from prior ED visits   Differential diagnosis includes, but is not limited to, anxiety, PVCs, SVT/AVNRT, other nonspecific cardiac arrhythmia, PE, ACS.  The patient is well-appearing and in no distress.  Normal EKG, lab work is all within normal limits including a negative high-sensitivity troponin and electrolytes.  She is low risk for ACS based on HEAR score.  PERC negative.  She is currently asymptomatic.  No focal neurological deficits.  She has an occasional PVC on the cardiac monitor and we talked about how that could be causing some of her symptoms.  I offered  to check a second troponin but I explained to the patient that it is unlikely to be helpful given her very low risk for ACS and symptoms that do not sound like ACS, and she would prefer to go home and get some rest.  I recommended outpatient follow-up with her PCP and I gave my usual and customary return precautions.          ____________________________________________  FINAL CLINICAL IMPRESSION(S) / ED DIAGNOSES  Final diagnoses:  Palpitations     MEDICATIONS GIVEN DURING THIS VISIT:  Medications - No data to display   ED Discharge Orders    None      *Please note:  Lindsey Peterson was evaluated in Emergency Department on 09/06/2019 for the symptoms described in the history of present illness. She was evaluated in the context of the global COVID-19 pandemic, which necessitated consideration that the patient  might be at risk for infection with the SARS-CoV-2 virus that causes COVID-19. Institutional protocols and algorithms that pertain to the evaluation of patients at risk for COVID-19 are in a state of rapid change based on information released by regulatory bodies including the CDC and federal and state organizations. These policies and algorithms were followed during the patient's care in the ED.  Some ED evaluations and interventions may be delayed as a result of limited staffing during the pandemic.*  Note:  This document was prepared using Dragon voice recognition software and may include unintentional dictation errors.   Hinda Kehr, MD 09/06/19 [phone]

## 2019-09-06 NOTE — Discharge Instructions (Addendum)
Your workup in the Emergency Department today was reassuring.  We did not find any specific abnormalities.  We recommend you drink plenty of fluids, take your regular medications and/or any new ones prescribed today, and follow up with the doctor(s) listed in these documents as recommended.  Return to the Emergency Department if you develop new or worsening symptoms that concern you.  

## 2019-09-06 NOTE — ED Triage Notes (Signed)
Patient ambulatory to triage with steady gait, without difficulty or distress noted, mask in place; pt reports driving home from work, 91TAV PTA, sudden onset "pressure in head", SHOB and heart "beating funny"; denies pain, denies hx of same

## 2019-09-06 NOTE — ED Notes (Signed)
Pt states she was driving home from work tonight at approx midnight and she started having "pressure" in her head along with what felt like "heart palpitations" and soreness/heaviness in her left arm.  Reports feeling "head pressure" and soreness in her arm at this time. Denies feeling heart palpitations at this time.  Denies cardiac hx.  Pt in NAD at this time.

## 2019-11-07 ENCOUNTER — Ambulatory Visit: Payer: BC Managed Care – PPO | Attending: Internal Medicine

## 2019-11-07 DIAGNOSIS — Z23 Encounter for immunization: Secondary | ICD-10-CM

## 2019-11-07 NOTE — Progress Notes (Signed)
   Covid-19 Vaccination Clinic  Name:  Lindsey Peterson    MRN: [id number] DOB: 1975-12-25  11/07/2019  Ms. [name] was observed post Covid-19 immunization for 15 minutes without incident. She was provided with Vaccine Information Sheet and instruction to access the V-Safe system.   Ms. [name] was instructed to call 911 with any severe reactions post vaccine: Marland Kitchen Difficulty breathing  . Swelling of face and throat  . A fast heartbeat  . A bad rash all over body  . Dizziness and weakness   Immunizations Administered    Name Date Dose VIS Date Route   Pfizer COVID-19 Vaccine 11/07/2019  1:50 PM 0.3 mL 07/26/2019 Intramuscular   Manufacturer: [employer], [employer]   Lot: [id number]   NDC: [address]-3

## 2019-11-15 ENCOUNTER — Other Ambulatory Visit: Payer: Self-pay | Admitting: Family Medicine

## 2019-11-15 DIAGNOSIS — Z1231 Encounter for screening mammogram for malignant neoplasm of breast: Secondary | ICD-10-CM

## 2019-12-02 ENCOUNTER — Ambulatory Visit: Payer: BC Managed Care – PPO | Attending: Internal Medicine

## 2019-12-02 DIAGNOSIS — Z23 Encounter for immunization: Secondary | ICD-10-CM

## 2019-12-02 NOTE — Progress Notes (Signed)
   Covid-19 Vaccination Clinic  Name:  Lindsey Peterson    MRN: [id number] DOB: 01/02/1976  12/02/2019  Ms. Fortenberry was observed post Covid-19 immunization for 15 minutes without incident. She was provided with Vaccine Information Sheet and instruction to access the V-Safe system.   Ms. Behanna was instructed to call 911 with any severe reactions post vaccine: Marland Kitchen Difficulty breathing  . Swelling of face and throat  . A fast heartbeat  . A bad rash all over body  . Dizziness and weakness   Immunizations Administered    Name Date Dose VIS Date Route   Pfizer COVID-19 Vaccine 12/02/2019  1:08 PM 0.3 mL 10/09/2018 Intramuscular   Manufacturer: [employer], [employer]   Lot: [id number]   NDC: [address]-2

## 2020-10-05 ENCOUNTER — Other Ambulatory Visit: Payer: Self-pay | Admitting: Neurology

## 2020-10-05 DIAGNOSIS — R197 Diarrhea, unspecified: Secondary | ICD-10-CM

## 2020-10-05 DIAGNOSIS — R42 Dizziness and giddiness: Secondary | ICD-10-CM

## 2020-10-05 DIAGNOSIS — R3915 Urgency of urination: Secondary | ICD-10-CM

## 2020-10-05 DIAGNOSIS — R2990 Unspecified symptoms and signs involving the nervous system: Secondary | ICD-10-CM

## 2020-10-05 DIAGNOSIS — R202 Paresthesia of skin: Secondary | ICD-10-CM

## 2021-02-18 ENCOUNTER — Other Ambulatory Visit: Payer: Self-pay | Admitting: Orthopedic Surgery

## 2021-03-08 ENCOUNTER — Other Ambulatory Visit
Admission: RE | Admit: 2021-03-08 | Discharge: 2021-03-08 | Disposition: A | Discharge status: Home / Self Care | Payer: BC Managed Care – PPO | Source: Ambulatory Visit | Attending: Orthopedic Surgery | Admitting: Orthopedic Surgery

## 2021-03-08 ENCOUNTER — Other Ambulatory Visit: Payer: Self-pay

## 2021-03-08 ENCOUNTER — Other Ambulatory Visit: Payer: Self-pay | Admitting: Orthopedic Surgery

## 2021-03-08 DIAGNOSIS — Z01818 Encounter for other preprocedural examination: Secondary | ICD-10-CM | POA: Insufficient documentation

## 2021-03-08 HISTORY — DX: Cardiac arrhythmia, unspecified: I49.9

## 2021-03-08 LAB — SURGICAL PCR SCREEN
MRSA, PCR: NEGATIVE
Staphylococcus aureus: NEGATIVE

## 2021-03-08 LAB — CBC WITH DIFFERENTIAL/PLATELET
Abs Immature Granulocytes: 0.04 10*3/uL (ref 0.00–0.07)
Basophils Absolute: 0.1 10*3/uL (ref 0.0–0.1)
Basophils Relative: 1 %
Eosinophils Absolute: 0.3 10*3/uL (ref 0.0–0.5)
Eosinophils Relative: 4 %
HCT: 32.4 % — ABNORMAL LOW (ref 36.0–46.0)
Hemoglobin: 11.1 g/dL — ABNORMAL LOW (ref 12.0–15.0)
Immature Granulocytes: 1 %
Lymphocytes Relative: 25 %
Lymphs Abs: 1.9 10*3/uL (ref 0.7–4.0)
MCH: 31.3 pg (ref 26.0–34.0)
MCHC: 34.3 g/dL (ref 30.0–36.0)
MCV: 91.3 fL (ref 80.0–100.0)
Monocytes Absolute: 0.5 10*3/uL (ref 0.1–1.0)
Monocytes Relative: 7 %
Neutro Abs: 4.7 10*3/uL (ref 1.7–7.7)
Neutrophils Relative %: 62 %
Platelets: 336 10*3/uL (ref 150–400)
RBC: 3.55 MIL/uL — ABNORMAL LOW (ref 3.87–5.11)
RDW: 12.7 % (ref 11.5–15.5)
WBC: 7.5 10*3/uL (ref 4.0–10.5)
nRBC: 0 % (ref 0.0–0.2)

## 2021-03-08 LAB — COMPREHENSIVE METABOLIC PANEL
ALT: 35 U/L (ref 0–44)
AST: 26 U/L (ref 15–41)
Albumin: 3.9 g/dL (ref 3.5–5.0)
Alkaline Phosphatase: 71 U/L (ref 38–126)
Anion gap: 11 (ref 5–15)
BUN: 22 mg/dL — ABNORMAL HIGH (ref 6–20)
CO2: 25 mmol/L (ref 22–32)
Calcium: 9 mg/dL (ref 8.9–10.3)
Chloride: 101 mmol/L (ref 98–111)
Creatinine, Ser: 0.89 mg/dL (ref 0.44–1.00)
GFR, Estimated: >60 mL/min (ref >60)
Glucose, Bld: 109 mg/dL — ABNORMAL HIGH (ref 70–99)
Potassium: 3.6 mmol/L (ref 3.5–5.1)
Sodium: 137 mmol/L (ref 135–145)
Total Bilirubin: 0.9 mg/dL (ref 0.3–1.2)
Total Protein: 7.4 g/dL (ref 6.5–8.1)

## 2021-03-08 LAB — TYPE AND SCREEN
ABO/RH(D): A POS
Antibody Screen: NEGATIVE

## 2021-03-08 LAB — URINALYSIS, ROUTINE W REFLEX MICROSCOPIC
Glucose, UA: NEGATIVE mg/dL
Ketones, ur: NEGATIVE mg/dL
Leukocytes,Ua: NEGATIVE
Nitrite: NEGATIVE
Protein, ur: NEGATIVE mg/dL
Specific Gravity, Urine: 1.019 (ref 1.005–1.030)
pH: 5 (ref 5.0–8.0)

## 2021-03-08 NOTE — Patient Instructions (Addendum)
Your procedure is scheduled on: Thursday March 18, 2021. Report to Day Surgery inside [facility] 2nd floor stop by admissions desk first before getting on elevator. To find out your arrival time please call [phone] between 1PM - 3PM on Wednesday March 17, 2021.  Remember: Instructions that are not followed completely may result in serious medical risk,  up to and including death, or upon the discretion of your surgeon and anesthesiologist your  surgery may need to be rescheduled.     _X__ 1. Do not eat food after midnight the night before your procedure.                 No chewing gum or hard candies. You may drink clear liquids up to 2 hours                 before you are scheduled to arrive for your surgery- DO not drink clear                 liquids within 2 hours of the start of your surgery.                 Clear Liquids include:  water, G2 or                  Gatorade Zero (avoid Red/Purple/Blue), Black Coffee or Tea (Do not add                 anything to coffee or tea).  __X__2.  Complete the "Ensure Clear Pre-surgery Clear Carbohydrate Drink" provided to you, 2 hours before arrival. **If you are diabetic you will be provided with an alternative drink, Gatorade Zero or G2.  __X__3.  On the morning of surgery brush your teeth with toothpaste and water, you                may rinse your mouth with mouthwash if you wish.  Do not swallow any toothpaste of mouthwash.     _X__ 4.  No Alcohol for 24 hours before or after surgery.   _X__ 5.  Do Not Smoke or use e-cigarettes For 24 Hours Prior to Your Surgery.                 Do not use any chewable tobacco products for at least 6 hours prior to                 Surgery.  _X__  6.  Do not use any recreational drugs (marijuana, cocaine, heroin, ecstasy, MDMA or other)                For at least one week prior to your surgery.  Combination of these drugs with anesthesia                May have life  threatening results.  __X__  7.  Notify your doctor if there is any change in your medical condition      (cold, fever, infections).     Do not wear jewelry, make-up, hairpins, clips or nail polish. Do not wear lotions, powders, or perfumes. You may wear deodorant. Do not shave 48 hours prior to surgery. Men may shave face and neck. Do not bring valuables to the hospital.    [facility] is not responsible for any belongings or valuables.  Contacts, dentures or bridgework may not be worn into surgery. Leave your suitcase in the car. After surgery it may be brought to your  room. For patients admitted to the hospital, discharge time is determined by your treatment team.   Patients discharged the day of surgery will not be allowed to drive home.   Make arrangements for someone to be with you for the first 24 hours of your Same Day Discharge.    Please read over the following fact sheets that you were given:   Total Joint Packet    __X__ Take these medicines the morning of surgery with A SIP OF WATER:    1. FLUoxetine (PROZAC) 80 MG  2. cetirizine (ZYRTEC) 10 MG  3.   4.  5.  6.  ____ Fleet Enema (as directed)   __X__ Use CHG Soap (or wipes) as directed  ____ Use Benzoyl Peroxide Gel as instructed  ____ Use inhalers on the day of surgery  __X__ Stop metformin 2 days prior to surgery (last dose will be 03/15/21)   ____ Take 1/2 of usual insulin dose the night before surgery. No insulin the morning          of surgery.   ____ Call your PCP, cardiologist, or Pulmonologist if taking Coumadin/Plavix/aspirin and ask when to stop before your surgery.   __X__ One Week prior to surgery- Stop Anti-inflammatories such as Ibuprofen, Aleve, Advil, Motrin, meloxicam (MOBIC), diclofenac, etodolac, ketorolac, Toradol, Daypro, piroxicam, Goody's or BC powders. OK TO USE TYLENOL IF NEEDED   __X__ Stop all supplements 1 week before your surgery.    ____ Bring C-Pap to the hospital.     If you have any questions regarding your pre-procedure instructions,  Please call Pre-admit Testing at [phone].

## 2021-03-16 ENCOUNTER — Other Ambulatory Visit
Admission: RE | Admit: 2021-03-16 | Discharge: 2021-03-16 | Disposition: A | Discharge status: Home / Self Care | Payer: BC Managed Care – PPO | Source: Ambulatory Visit | Attending: Orthopedic Surgery | Admitting: Orthopedic Surgery

## 2021-03-16 ENCOUNTER — Other Ambulatory Visit: Payer: Self-pay

## 2021-03-16 DIAGNOSIS — Z20822 Contact with and (suspected) exposure to covid-19: Secondary | ICD-10-CM | POA: Insufficient documentation

## 2021-03-16 DIAGNOSIS — Z01812 Encounter for preprocedural laboratory examination: Secondary | ICD-10-CM | POA: Insufficient documentation

## 2021-03-16 LAB — SARS CORONAVIRUS 2 (TAT 6-24 HRS): SARS Coronavirus 2: NEGATIVE

## 2021-03-18 ENCOUNTER — Inpatient Hospital Stay: Payer: BC Managed Care – PPO | Admitting: Urgent Care

## 2021-03-18 ENCOUNTER — Encounter: Payer: Self-pay | Admitting: Orthopedic Surgery

## 2021-03-18 ENCOUNTER — Inpatient Hospital Stay
Admission: RE | Admit: 2021-03-18 | Discharge: 2021-03-21 | DRG: 470 | Disposition: A | Discharge status: Home Health | Payer: BC Managed Care – PPO | Attending: Orthopedic Surgery | Admitting: Orthopedic Surgery

## 2021-03-18 ENCOUNTER — Inpatient Hospital Stay: Payer: BC Managed Care – PPO

## 2021-03-18 ENCOUNTER — Other Ambulatory Visit: Payer: Self-pay

## 2021-03-18 ENCOUNTER — Encounter
Admission: RE | Disposition: A | Discharge status: Home / Self Care | Payer: Self-pay | Source: Home / Self Care | Attending: Orthopedic Surgery

## 2021-03-18 DIAGNOSIS — F419 Anxiety disorder, unspecified: Secondary | ICD-10-CM | POA: Diagnosis present

## 2021-03-18 DIAGNOSIS — Z9049 Acquired absence of other specified parts of digestive tract: Secondary | ICD-10-CM

## 2021-03-18 DIAGNOSIS — Z79899 Other long term (current) drug therapy: Secondary | ICD-10-CM | POA: Diagnosis not present

## 2021-03-18 DIAGNOSIS — Z975 Presence of (intrauterine) contraceptive device: Secondary | ICD-10-CM | POA: Diagnosis not present

## 2021-03-18 DIAGNOSIS — F32A Depression, unspecified: Secondary | ICD-10-CM | POA: Diagnosis present

## 2021-03-18 DIAGNOSIS — I10 Essential (primary) hypertension: Secondary | ICD-10-CM | POA: Diagnosis present

## 2021-03-18 DIAGNOSIS — Z793 Long term (current) use of hormonal contraceptives: Secondary | ICD-10-CM

## 2021-03-18 DIAGNOSIS — Z7984 Long term (current) use of oral hypoglycemic drugs: Secondary | ICD-10-CM | POA: Diagnosis not present

## 2021-03-18 DIAGNOSIS — Z833 Family history of diabetes mellitus: Secondary | ICD-10-CM | POA: Diagnosis not present

## 2021-03-18 DIAGNOSIS — E782 Mixed hyperlipidemia: Secondary | ICD-10-CM | POA: Diagnosis present

## 2021-03-18 DIAGNOSIS — Z20822 Contact with and (suspected) exposure to covid-19: Secondary | ICD-10-CM | POA: Diagnosis present

## 2021-03-18 DIAGNOSIS — M1612 Unilateral primary osteoarthritis, left hip: Secondary | ICD-10-CM | POA: Diagnosis present

## 2021-03-18 DIAGNOSIS — M25552 Pain in left hip: Secondary | ICD-10-CM | POA: Diagnosis present

## 2021-03-18 DIAGNOSIS — E119 Type 2 diabetes mellitus without complications: Secondary | ICD-10-CM | POA: Diagnosis present

## 2021-03-18 DIAGNOSIS — Z882 Allergy status to sulfonamides status: Secondary | ICD-10-CM | POA: Diagnosis not present

## 2021-03-18 DIAGNOSIS — Z888 Allergy status to other drugs, medicaments and biological substances status: Secondary | ICD-10-CM | POA: Diagnosis not present

## 2021-03-18 DIAGNOSIS — J45909 Unspecified asthma, uncomplicated: Secondary | ICD-10-CM | POA: Diagnosis present

## 2021-03-18 DIAGNOSIS — G43909 Migraine, unspecified, not intractable, without status migrainosus: Secondary | ICD-10-CM | POA: Diagnosis present

## 2021-03-18 DIAGNOSIS — Z6841 Body Mass Index (BMI) 40.0 and over, adult: Secondary | ICD-10-CM

## 2021-03-18 DIAGNOSIS — Z8249 Family history of ischemic heart disease and other diseases of the circulatory system: Secondary | ICD-10-CM | POA: Diagnosis not present

## 2021-03-18 DIAGNOSIS — Z419 Encounter for procedure for purposes other than remedying health state, unspecified: Secondary | ICD-10-CM

## 2021-03-18 DIAGNOSIS — Z96642 Presence of left artificial hip joint: Secondary | ICD-10-CM

## 2021-03-18 DIAGNOSIS — G8918 Other acute postprocedural pain: Secondary | ICD-10-CM

## 2021-03-18 HISTORY — PX: TOTAL HIP ARTHROPLASTY: SHX124

## 2021-03-18 LAB — CREATININE, SERUM
Creatinine, Ser: 0.79 mg/dL (ref 0.44–1.00)
GFR, Estimated: >60 mL/min (ref >60)

## 2021-03-18 LAB — CBC
HCT: 30.1 % — ABNORMAL LOW (ref 36.0–46.0)
Hemoglobin: 10.2 g/dL — ABNORMAL LOW (ref 12.0–15.0)
MCH: 31.5 pg (ref 26.0–34.0)
MCHC: 33.9 g/dL (ref 30.0–36.0)
MCV: 92.9 fL (ref 80.0–100.0)
Platelets: 321 10*3/uL (ref 150–400)
RBC: 3.24 MIL/uL — ABNORMAL LOW (ref 3.87–5.11)
RDW: 12.9 % (ref 11.5–15.5)
WBC: 16.7 10*3/uL — ABNORMAL HIGH (ref 4.0–10.5)
nRBC: 0 % (ref 0.0–0.2)

## 2021-03-18 LAB — GLUCOSE, CAPILLARY
Glucose-Capillary: 125 mg/dL — ABNORMAL HIGH (ref 70–99)
Glucose-Capillary: 123 mg/dL — ABNORMAL HIGH (ref 70–99)
Glucose-Capillary: 142 mg/dL — ABNORMAL HIGH (ref 70–99)
Glucose-Capillary: 132 mg/dL — ABNORMAL HIGH (ref 70–99)

## 2021-03-18 LAB — ABO/RH: ABO/RH(D): A POS

## 2021-03-18 LAB — POCT PREGNANCY, URINE: Preg Test, Ur: NEGATIVE

## 2021-03-18 SURGERY — ARTHROPLASTY, HIP, TOTAL, ANTERIOR APPROACH
Anesthesia: Spinal | Site: Hip

## 2021-03-18 MED ORDER — METHOCARBAMOL 1000 MG/10ML IJ SOLN
500.0000 mg | Freq: Four times a day (QID) | INTRAVENOUS | Status: DC | PRN
  Filled 2021-03-18: qty 5

## 2021-03-18 MED ORDER — PHENYLEPHRINE HCL (PRESSORS) 10 MG/ML IV SOLN
INTRAVENOUS | Status: DC | PRN
  Administered 2021-03-18: 100 ug via INTRAVENOUS
  Administered 2021-03-18: 200 ug via INTRAVENOUS
  Administered 2021-03-18: 100 ug via INTRAVENOUS

## 2021-03-18 MED ORDER — LORATADINE 10 MG PO TABS
10.0000 mg | ORAL_TABLET | Freq: Every day | ORAL | Status: DC
  Filled 2021-03-18 (×4): qty 1

## 2021-03-18 MED ORDER — OXYCODONE HCL 5 MG PO TABS
5.0000 mg | ORAL_TABLET | ORAL | Status: DC | PRN
  Administered 2021-03-19 (×2): 10 mg via ORAL
  Filled 2021-03-18 (×3): qty 2

## 2021-03-18 MED ORDER — CHLORHEXIDINE GLUCONATE 0.12 % MT SOLN
OROMUCOSAL | Status: AC
  Administered 2021-03-18: 15 mL via OROMUCOSAL
  Filled 2021-03-18: qty 15

## 2021-03-18 MED ORDER — HYDROMORPHONE HCL 1 MG/ML IJ SOLN
0.5000 mg | INTRAMUSCULAR | Status: DC | PRN
  Administered 2021-03-18 – 2021-03-19 (×6): 1 mg via INTRAVENOUS
  Filled 2021-03-18 (×6): qty 1

## 2021-03-18 MED ORDER — OXYCODONE HCL 5 MG PO TABS
ORAL_TABLET | ORAL | Status: AC
  Filled 2021-03-18: qty 1

## 2021-03-18 MED ORDER — FAMOTIDINE 20 MG PO TABS
20.0000 mg | ORAL_TABLET | Freq: Once | ORAL | Status: DC
  Filled 2021-03-18: qty 1

## 2021-03-18 MED ORDER — ATORVASTATIN CALCIUM 10 MG PO TABS
10.0000 mg | ORAL_TABLET | Freq: Every day | ORAL | Status: DC
  Administered 2021-03-18 – 2021-03-21 (×4): 10 mg via ORAL
  Filled 2021-03-18 (×4): qty 1

## 2021-03-18 MED ORDER — ZOLPIDEM TARTRATE 5 MG PO TABS: 5.0000 mg | ORAL_TABLET | Freq: Every evening | ORAL | Status: DC | PRN

## 2021-03-18 MED ORDER — CELECOXIB 200 MG PO CAPS: 200.0000 mg | ORAL_CAPSULE | Freq: Two times a day (BID) | ORAL | Status: DC

## 2021-03-18 MED ORDER — PANTOPRAZOLE SODIUM 40 MG PO TBEC
40.0000 mg | DELAYED_RELEASE_TABLET | Freq: Every day | ORAL | Status: DC
  Administered 2021-03-18 – 2021-03-20 (×3): 40 mg via ORAL
  Filled 2021-03-18 (×4): qty 1

## 2021-03-18 MED ORDER — HYDROCHLOROTHIAZIDE 25 MG PO TABS
50.0000 mg | ORAL_TABLET | Freq: Every day | ORAL | Status: DC
  Administered 2021-03-18 – 2021-03-20 (×3): 50 mg via ORAL
  Filled 2021-03-18 (×2): qty 2
  Filled 2021-03-18: qty 1
  Filled 2021-03-18: qty 2

## 2021-03-18 MED ORDER — VITAMIN D3 25 MCG (1000 UNIT) PO TABS
1000.0000 [IU] | ORAL_TABLET | Freq: Every day | ORAL | Status: DC
  Administered 2021-03-18 – 2021-03-21 (×4): 1000 [IU] via ORAL
  Filled 2021-03-18 (×7): qty 1

## 2021-03-18 MED ORDER — INSULIN ASPART 100 UNIT/ML IJ SOLN
0.0000 [IU] | Freq: Three times a day (TID) | INTRAMUSCULAR | Status: DC
  Administered 2021-03-18 – 2021-03-21 (×8): 2 [IU] via SUBCUTANEOUS
  Filled 2021-03-18 (×8): qty 1

## 2021-03-18 MED ORDER — ALUM & MAG HYDROXIDE-SIMETH 200-200-20 MG/5ML PO SUSP: 30.0000 mL | ORAL | Status: DC | PRN

## 2021-03-18 MED ORDER — OXYCODONE HCL 5 MG PO TABS
10.0000 mg | ORAL_TABLET | ORAL | Status: DC | PRN
  Administered 2021-03-18: 10 mg via ORAL
  Administered 2021-03-18: 15 mg via ORAL
  Administered 2021-03-18: 5 mg via ORAL
  Administered 2021-03-19 – 2021-03-21 (×6): 15 mg via ORAL
  Filled 2021-03-18 (×7): qty 3

## 2021-03-18 MED ORDER — BUPIVACAINE-EPINEPHRINE 0.25% -1:200000 IJ SOLN
INTRAMUSCULAR | Status: DC | PRN
  Administered 2021-03-18: 30 mL

## 2021-03-18 MED ORDER — ACETAMINOPHEN 10 MG/ML IV SOLN
INTRAVENOUS | Status: AC
  Filled 2021-03-18: qty 100

## 2021-03-18 MED ORDER — PROPOFOL 1000 MG/100ML IV EMUL
INTRAVENOUS | Status: AC
  Filled 2021-03-18: qty 100

## 2021-03-18 MED ORDER — PROPOFOL 500 MG/50ML IV EMUL
INTRAVENOUS | Status: DC | PRN
  Administered 2021-03-18: 140 ug/kg/min via INTRAVENOUS

## 2021-03-18 MED ORDER — MICROFIBRILLAR COLL HEMOSTAT EX PADS
MEDICATED_PAD | CUTANEOUS | Status: DC | PRN
  Administered 2021-03-18: 2 via TOPICAL

## 2021-03-18 MED ORDER — BUPIVACAINE LIPOSOME 1.3 % IJ SUSP
INTRAMUSCULAR | Status: AC
  Filled 2021-03-18: qty 20

## 2021-03-18 MED ORDER — TRAMADOL HCL 50 MG PO TABS
ORAL_TABLET | ORAL | Status: AC
  Administered 2021-03-18: 50 mg via ORAL
  Filled 2021-03-18: qty 1

## 2021-03-18 MED ORDER — ORAL CARE MOUTH RINSE: 15.0000 mL | Freq: Once | OROMUCOSAL | Status: AC

## 2021-03-18 MED ORDER — NEOMYCIN-POLYMYXIN B GU 40-200000 IR SOLN
Status: AC
  Filled 2021-03-18: qty 20

## 2021-03-18 MED ORDER — ONDANSETRON HCL 4 MG/2ML IJ SOLN
INTRAMUSCULAR | Status: DC | PRN
  Administered 2021-03-18: 4 mg via INTRAVENOUS

## 2021-03-18 MED ORDER — MAGNESIUM OXIDE -MG SUPPLEMENT 400 (240 MG) MG PO TABS
400.0000 mg | ORAL_TABLET | Freq: Every day | ORAL | Status: DC
  Administered 2021-03-18 – 2021-03-21 (×4): 400 mg via ORAL
  Filled 2021-03-18 (×4): qty 1

## 2021-03-18 MED ORDER — METHOCARBAMOL 500 MG PO TABS
500.0000 mg | ORAL_TABLET | Freq: Four times a day (QID) | ORAL | Status: DC | PRN
  Administered 2021-03-18 – 2021-03-21 (×5): 500 mg via ORAL
  Filled 2021-03-18 (×5): qty 1

## 2021-03-18 MED ORDER — FENTANYL CITRATE (PF) 100 MCG/2ML IJ SOLN
INTRAMUSCULAR | Status: DC | PRN
  Administered 2021-03-18: 100 ug via INTRAVENOUS

## 2021-03-18 MED ORDER — ACETAMINOPHEN 10 MG/ML IV SOLN: 1000.0000 mg | Freq: Four times a day (QID) | INTRAVENOUS | Status: DC

## 2021-03-18 MED ORDER — PHENOL 1.4 % MT LIQD
1.0000 | OROMUCOSAL | Status: DC | PRN
  Filled 2021-03-18: qty 177

## 2021-03-18 MED ORDER — PHENYLEPHRINE HCL (PRESSORS) 10 MG/ML IV SOLN
INTRAVENOUS | Status: AC
  Filled 2021-03-18: qty 1

## 2021-03-18 MED ORDER — FLEET ENEMA 7-19 GM/118ML RE ENEM: 1.0000 | ENEMA | Freq: Once | RECTAL | Status: DC | PRN

## 2021-03-18 MED ORDER — DOCUSATE SODIUM 100 MG PO CAPS
100.0000 mg | ORAL_CAPSULE | Freq: Two times a day (BID) | ORAL | Status: DC
  Administered 2021-03-18 – 2021-03-20 (×5): 100 mg via ORAL
  Filled 2021-03-18 (×6): qty 1

## 2021-03-18 MED ORDER — LEVONORGESTREL 20 MCG/24HR IU IUD: 1.0000 | INTRAUTERINE_SYSTEM | Freq: Once | INTRAUTERINE | Status: DC

## 2021-03-18 MED ORDER — DIPHENHYDRAMINE HCL 12.5 MG/5ML PO ELIX
12.5000 mg | ORAL_SOLUTION | ORAL | Status: DC | PRN
  Filled 2021-03-18: qty 10

## 2021-03-18 MED ORDER — MIDAZOLAM HCL 5 MG/5ML IJ SOLN
INTRAMUSCULAR | Status: DC | PRN
  Administered 2021-03-18: 2 mg via INTRAVENOUS

## 2021-03-18 MED ORDER — CEFAZOLIN IN SODIUM CHLORIDE 3-0.9 GM/100ML-% IV SOLN
3.0000 g | INTRAVENOUS | Status: DC
  Filled 2021-03-18: qty 100

## 2021-03-18 MED ORDER — BISACODYL 10 MG RE SUPP
10.0000 mg | Freq: Every day | RECTAL | Status: DC | PRN
  Filled 2021-03-18: qty 1

## 2021-03-18 MED ORDER — SODIUM CHLORIDE 0.9 % IV SOLN: INTRAVENOUS | Status: DC

## 2021-03-18 MED ORDER — LISINOPRIL 10 MG PO TABS
10.0000 mg | ORAL_TABLET | Freq: Every day | ORAL | Status: DC
  Administered 2021-03-18 – 2021-03-21 (×4): 10 mg via ORAL
  Filled 2021-03-18 (×4): qty 1

## 2021-03-18 MED ORDER — METFORMIN HCL ER 500 MG PO TB24
1000.0000 mg | ORAL_TABLET | Freq: Two times a day (BID) | ORAL | Status: DC
  Administered 2021-03-18 – 2021-03-21 (×6): 1000 mg via ORAL
  Filled 2021-03-18 (×7): qty 2

## 2021-03-18 MED ORDER — FENTANYL CITRATE (PF) 100 MCG/2ML IJ SOLN: 25.0000 ug | INTRAMUSCULAR | Status: DC | PRN

## 2021-03-18 MED ORDER — SODIUM CHLORIDE FLUSH 0.9 % IV SOLN
INTRAVENOUS | Status: AC
  Filled 2021-03-18: qty 40

## 2021-03-18 MED ORDER — TRAMADOL HCL 50 MG PO TABS
50.0000 mg | ORAL_TABLET | Freq: Four times a day (QID) | ORAL | Status: DC
  Administered 2021-03-18 – 2021-03-21 (×9): 50 mg via ORAL
  Filled 2021-03-18 (×9): qty 1

## 2021-03-18 MED ORDER — 0.9 % SODIUM CHLORIDE (POUR BTL) OPTIME
TOPICAL | Status: DC | PRN
  Administered 2021-03-18: 1000 mL

## 2021-03-18 MED ORDER — SODIUM CHLORIDE 0.9 % IV SOLN
INTRAVENOUS | Status: DC | PRN
  Administered 2021-03-18: 60 mL

## 2021-03-18 MED ORDER — FLUOXETINE HCL 20 MG PO CAPS
80.0000 mg | ORAL_CAPSULE | Freq: Every day | ORAL | Status: DC
  Administered 2021-03-19: 80 mg via ORAL
  Filled 2021-03-18 (×2): qty 4

## 2021-03-18 MED ORDER — KETOROLAC TROMETHAMINE 15 MG/ML IJ SOLN
15.0000 mg | Freq: Four times a day (QID) | INTRAMUSCULAR | Status: AC | PRN
  Administered 2021-03-18 – 2021-03-20 (×5): 15 mg via INTRAVENOUS
  Filled 2021-03-18 (×5): qty 1

## 2021-03-18 MED ORDER — ENOXAPARIN SODIUM 40 MG/0.4ML IJ SOSY
40.0000 mg | PREFILLED_SYRINGE | INTRAMUSCULAR | Status: DC
  Administered 2021-03-19 – 2021-03-21 (×3): 40 mg via SUBCUTANEOUS
  Filled 2021-03-18 (×3): qty 0.4

## 2021-03-18 MED ORDER — FENTANYL CITRATE (PF) 100 MCG/2ML IJ SOLN
INTRAMUSCULAR | Status: AC
  Filled 2021-03-18: qty 2

## 2021-03-18 MED ORDER — ACETAMINOPHEN 10 MG/ML IV SOLN
1000.0000 mg | Freq: Four times a day (QID) | INTRAVENOUS | Status: AC
  Administered 2021-03-18 – 2021-03-19 (×4): 1000 mg via INTRAVENOUS
  Filled 2021-03-18 (×4): qty 100

## 2021-03-18 MED ORDER — BUPIVACAINE HCL (PF) 0.5 % IJ SOLN
INTRAMUSCULAR | Status: DC | PRN
  Administered 2021-03-18: 2.8 mL via INTRATHECAL

## 2021-03-18 MED ORDER — EPHEDRINE SULFATE 50 MG/ML IJ SOLN
INTRAMUSCULAR | Status: DC | PRN
  Administered 2021-03-18 (×2): 10 mg via INTRAVENOUS

## 2021-03-18 MED ORDER — CHLORHEXIDINE GLUCONATE 0.12 % MT SOLN
15.0000 mL | Freq: Once | OROMUCOSAL | Status: AC
Start: 2021-03-18 — End: 2021-03-18

## 2021-03-18 MED ORDER — MENTHOL 3 MG MT LOZG
1.0000 | LOZENGE | OROMUCOSAL | Status: DC | PRN
  Filled 2021-03-18: qty 9

## 2021-03-18 MED ORDER — POLYETHYLENE GLYCOL 3350 17 G PO PACK
17.0000 g | PACK | Freq: Every day | ORAL | Status: DC | PRN
  Filled 2021-03-18: qty 1

## 2021-03-18 MED ORDER — TRIAMCINOLONE ACETONIDE 0.5 % EX CREA
1.0000 "application " | TOPICAL_CREAM | Freq: Two times a day (BID) | CUTANEOUS | Status: DC | PRN
  Filled 2021-03-18: qty 15

## 2021-03-18 MED ORDER — ACETAMINOPHEN 325 MG PO TABS
325.0000 mg | ORAL_TABLET | Freq: Four times a day (QID) | ORAL | Status: DC | PRN
Start: 2021-03-19 — End: 2021-03-21
  Administered 2021-03-20: 650 mg via ORAL
  Filled 2021-03-18: qty 2

## 2021-03-18 MED ORDER — ONDANSETRON HCL 4 MG/2ML IJ SOLN: 4.0000 mg | Freq: Once | INTRAMUSCULAR | Status: DC | PRN

## 2021-03-18 MED ORDER — SODIUM CHLORIDE 0.9 % IV SOLN
INTRAVENOUS | Status: DC | PRN
  Administered 2021-03-18: 15 ug/min via INTRAVENOUS

## 2021-03-18 MED ORDER — ONDANSETRON HCL 4 MG PO TABS: 4.0000 mg | ORAL_TABLET | Freq: Four times a day (QID) | ORAL | Status: DC | PRN

## 2021-03-18 MED ORDER — ACETAMINOPHEN 10 MG/ML IV SOLN
INTRAVENOUS | Status: DC | PRN
  Administered 2021-03-18: 1000 mg via INTRAVENOUS

## 2021-03-18 MED ORDER — OXYCODONE HCL 5 MG PO TABS
ORAL_TABLET | ORAL | Status: AC
  Filled 2021-03-18: qty 2

## 2021-03-18 MED ORDER — CEFAZOLIN IN SODIUM CHLORIDE 3-0.9 GM/100ML-% IV SOLN
3.0000 g | INTRAVENOUS | Status: AC
  Administered 2021-03-18: 3 g via INTRAVENOUS
  Filled 2021-03-18: qty 100

## 2021-03-18 MED ORDER — ONDANSETRON HCL 4 MG/2ML IJ SOLN: 4.0000 mg | Freq: Four times a day (QID) | INTRAMUSCULAR | Status: DC | PRN

## 2021-03-18 MED ORDER — MIDAZOLAM HCL 2 MG/2ML IJ SOLN
INTRAMUSCULAR | Status: AC
  Filled 2021-03-18: qty 2

## 2021-03-18 MED ORDER — EPHEDRINE 5 MG/ML INJ
INTRAVENOUS | Status: AC
  Filled 2021-03-18: qty 5

## 2021-03-18 SURGICAL SUPPLY — 65 items
APL PRP STRL LF DISP 70% ISPRP (MISCELLANEOUS) ×2
BLADE SAGITTAL AGGR TOOTH XLG (BLADE) ×3 IMPLANT
BNDG COHESIVE 6X5 TAN ST LF (GAUZE/BANDAGES/DRESSINGS) ×9 IMPLANT
CANISTER WOUND CARE 500ML ATS (WOUND CARE) ×3 IMPLANT
CHLORAPREP W/TINT 26 (MISCELLANEOUS) ×3 IMPLANT
COVER BACK TABLE REUSABLE LG (DRAPES) ×3 IMPLANT
DRAPE 3/4 80X56 (DRAPES) ×9 IMPLANT
DRAPE C-ARM XRAY 36X54 (DRAPES) ×3 IMPLANT
DRAPE INCISE IOBAN 66X60 STRL (DRAPES) IMPLANT
DRAPE POUCH INSTRU U-SHP 10X18 (DRAPES) ×3 IMPLANT
DRESSING PEEL AND PLC PRVNA 13 (GAUZE/BANDAGES/DRESSINGS) IMPLANT
DRESSING SURGICEL FIBRLLR 1X2 (HEMOSTASIS) ×4 IMPLANT
DRSG MEPILEX SACRM 8.7X9.8 (GAUZE/BANDAGES/DRESSINGS) ×3 IMPLANT
DRSG OPSITE POSTOP 4X8 (GAUZE/BANDAGES/DRESSINGS) ×4 IMPLANT
DRSG PEEL AND PLACE PREVENA 13 (GAUZE/BANDAGES/DRESSINGS) ×3
DRSG SURGICEL FIBRILLAR 1X2 (HEMOSTASIS) ×6
ELECT BLADE 6.5 EXT (BLADE) ×3 IMPLANT
ELECT REM PT RETURN 9FT ADLT (ELECTROSURGICAL) ×3
ELECTRODE REM PT RTRN 9FT ADLT (ELECTROSURGICAL) ×2 IMPLANT
GAUZE 4X4 16PLY ~~LOC~~+RFID DBL (SPONGE) ×3 IMPLANT
GLOVE SURG SYN 9.0  PF PI (GLOVE) ×6
GLOVE SURG SYN 9.0 PF PI (GLOVE) ×4 IMPLANT
GLOVE SURG UNDER POLY LF SZ9 (GLOVE) ×3 IMPLANT
GOWN SRG 2XL LVL 4 RGLN SLV (GOWNS) ×2 IMPLANT
GOWN STRL NON-REIN 2XL LVL4 (GOWNS) ×3
GOWN STRL REUS W/ TWL LRG LVL3 (GOWN DISPOSABLE) ×2 IMPLANT
GOWN STRL REUS W/TWL LRG LVL3 (GOWN DISPOSABLE) ×3
HEAD FEMORAL 28MM SZ S (Head) ×1 IMPLANT
HEMOVAC 400CC 10FR (MISCELLANEOUS) IMPLANT
HIP DBL LINER 54X28 (Liner) ×1 IMPLANT
HOLDER FOLEY CATH W/STRAP (MISCELLANEOUS) ×3 IMPLANT
HOOD PEEL AWAY FLYTE STAYCOOL (MISCELLANEOUS) ×3 IMPLANT
IRRIGATION SURGIPHOR STRL (IV SOLUTION) IMPLANT
KIT PREVENA INCISION MGT 13 (CANNISTER) ×3 IMPLANT
MANIFOLD NEPTUNE II (INSTRUMENTS) ×3 IMPLANT
MAT ABSORB  FLUID 56X50 GRAY (MISCELLANEOUS) ×3
MAT ABSORB FLUID 56X50 GRAY (MISCELLANEOUS) ×2 IMPLANT
NDL SAFETY ECLIPSE 18X1.5 (NEEDLE) ×2 IMPLANT
NDL SPNL 20GX3.5 QUINCKE YW (NEEDLE) ×4 IMPLANT
NEEDLE HYPO 18GX1.5 SHARP (NEEDLE) ×3
NEEDLE SPNL 20GX3.5 QUINCKE YW (NEEDLE) ×6 IMPLANT
NS IRRIG 1000ML POUR BTL (IV SOLUTION) ×3 IMPLANT
PACK HIP COMPR (MISCELLANEOUS) ×3 IMPLANT
SCALPEL PROTECTED #10 DISP (BLADE) ×6 IMPLANT
SHELL ACETABULAR SZ 54 DM (Shell) ×1 IMPLANT
SOL PREP PVP 2OZ (MISCELLANEOUS) ×3
SOLUTION PREP PVP 2OZ (MISCELLANEOUS) ×2 IMPLANT
SPONGE DRAIN TRACH 4X4 STRL 2S (GAUZE/BANDAGES/DRESSINGS) ×3 IMPLANT
SPONGE T-LAP 18X18 ~~LOC~~+RFID (SPONGE) ×6 IMPLANT
STAPLER SKIN PROX 35W (STAPLE) ×3 IMPLANT
STEM MASTERLOC HIP LATERAL SZ8 (Stem) ×1 IMPLANT
STRAP SAFETY 5IN WIDE (MISCELLANEOUS) ×3 IMPLANT
SUT DVC 2 QUILL PDO  T11 36X36 (SUTURE) ×3
SUT DVC 2 QUILL PDO T11 36X36 (SUTURE) ×2 IMPLANT
SUT SILK 0 (SUTURE) ×3
SUT SILK 0 30XBRD TIE 6 (SUTURE) ×2 IMPLANT
SUT V-LOC 90 ABS DVC 3-0 CL (SUTURE) ×3 IMPLANT
SUT VIC AB 1 CT1 36 (SUTURE) ×3 IMPLANT
SYR 20ML LL LF (SYRINGE) ×3 IMPLANT
SYR 30ML LL (SYRINGE) ×3 IMPLANT
SYR 50ML LL SCALE MARK (SYRINGE) ×6 IMPLANT
SYR BULB IRRIG 60ML STRL (SYRINGE) ×3 IMPLANT
TAPE MICROFOAM 4IN (TAPE) ×3 IMPLANT
TOWEL OR 17X26 4PK STRL BLUE (TOWEL DISPOSABLE) ×3 IMPLANT
TRAY FOLEY MTR SLVR 16FR STAT (SET/KITS/TRAYS/PACK) ×3 IMPLANT

## 2021-03-18 NOTE — Progress Notes (Signed)
PT Cancellation Note  Patient Details Name: Lindsey Peterson MRN: [id number] DOB: [birth year]   Cancelled Treatment:    Reason Eval/Treat Not Completed: Pain limiting ability to participate.  PT chart reviewed and attempted to see pt.  Upon arrival pt reported 8/10 pain and requesting pain meds prior to being seen.  Nursing notified and pain meds were being given.  Will attempt evaluation at later date/time as medically appropriate.     Nolon Bussing, PT, DPT 03/18/21, 4:04 PM

## 2021-03-18 NOTE — Transfer of Care (Signed)
Immediate Anesthesia Transfer of Care Note  Patient: Lindsey Peterson  Procedure(s) Performed: TOTAL HIP ARTHROPLASTY ANTERIOR APPROACH (Left: Hip) APPLICATION OF CELL SAVER  Patient Location: PACU  Anesthesia Type:Spinal  Level of Consciousness: drowsy  Airway & Oxygen Therapy: Patient Spontanous Breathing and Patient connected to face mask oxygen  Post-op Assessment: Report given to RN  Post vital signs: stable  Last Vitals:  Vitals Value Taken Time  BP    Temp    Pulse 82 03/18/21 1230  Resp    SpO2 100 % 03/18/21 1230  Vitals shown include unvalidated device data.  Last Pain:  Vitals:   03/18/21 0827  TempSrc: Temporal  PainSc: 0-No pain         Complications: No notable events documented.

## 2021-03-18 NOTE — Plan of Care (Signed)
Pt received from PACU left total hip. Alert and oriented. VSS stable. Woundvac with no drainage present. Foley in place. Ice pack applied to incision.  Problem: Education: Goal: Knowledge of General Education information will improve Description: Including pain rating scale, medication(s)/side effects and non-pharmacologic comfort measures Outcome: Progressing   Problem: Health Behavior/Discharge Planning: Goal: Ability to manage health-related needs will improve Outcome: Progressing   Problem: Clinical Measurements: Goal: Ability to maintain clinical measurements within normal limits will improve Outcome: Progressing Goal: Will remain free from infection Outcome: Progressing Goal: Diagnostic test results will improve Outcome: Progressing Goal: Respiratory complications will improve Outcome: Progressing Goal: Cardiovascular complication will be avoided Outcome: Progressing   Problem: Activity: Goal: Risk for activity intolerance will decrease Outcome: Progressing   Problem: Nutrition: Goal: Adequate nutrition will be maintained Outcome: Progressing   Problem: Coping: Goal: Level of anxiety will decrease Outcome: Progressing   Problem: Elimination: Goal: Will not experience complications related to bowel motility Outcome: Progressing Goal: Will not experience complications related to urinary retention Outcome: Progressing   Problem: Pain Managment: Goal: General experience of comfort will improve Outcome: Progressing   Problem: Skin Integrity: Goal: Risk for impaired skin integrity will decrease Outcome: Progressing   Problem: Education: Goal: Knowledge of the prescribed therapeutic regimen will improve Outcome: Progressing Goal: Understanding of discharge needs will improve Outcome: Progressing   Problem: Activity: Goal: Ability to avoid complications of mobility impairment will improve Outcome: Progressing Goal: Ability to tolerate increased activity will  improve Outcome: Progressing   Problem: Clinical Measurements: Goal: Postoperative complications will be avoided or minimized Outcome: Progressing   Problem: Pain Management: Goal: Pain level will decrease with appropriate interventions Outcome: Progressing   Problem: Skin Integrity: Goal: Will show signs of wound healing Outcome: Progressing   Problem: Education: Goal: Knowledge of General Education information will improve Description: Including pain rating scale, medication(s)/side effects and non-pharmacologic comfort measures Outcome: Progressing   Problem: Health Behavior/Discharge Planning: Goal: Ability to manage health-related needs will improve Outcome: Progressing   Problem: Clinical Measurements: Goal: Ability to maintain clinical measurements within normal limits will improve Outcome: Progressing Goal: Will remain free from infection Outcome: Progressing Goal: Diagnostic test results will improve Outcome: Progressing Goal: Respiratory complications will improve Outcome: Progressing Goal: Cardiovascular complication will be avoided Outcome: Progressing   Problem: Activity: Goal: Risk for activity intolerance will decrease Outcome: Progressing   Problem: Nutrition: Goal: Adequate nutrition will be maintained Outcome: Progressing   Problem: Coping: Goal: Level of anxiety will decrease Outcome: Progressing   Problem: Elimination: Goal: Will not experience complications related to bowel motility Outcome: Progressing Goal: Will not experience complications related to urinary retention Outcome: Progressing   Problem: Pain Managment: Goal: General experience of comfort will improve Outcome: Progressing   Problem: Skin Integrity: Goal: Risk for impaired skin integrity will decreas Problem: Education: Goal: Knowledge of the prescribed therapeutic regimen will improve Outcome: Progressing Goal: Understanding of discharge needs will improve Outcome:  Progressing   Problem: Activity: Goal: Ability to avoid complications of mobility impairment will improve Outcome: Progressing Goal: Ability to tolerate increased activity will improve Outcome: Progressing   Problem: Clinical Measurements: Goal: Postoperative complications will be avoided or minimized Outcome: Progressing   Problem: Pain Management: Goal: Pain level will decrease with appropriate interventions Outcome: Progressing   Problem: Skin Integrity: Goal: Will show signs of wound healing Outcome: Progressing

## 2021-03-18 NOTE — Progress Notes (Signed)
PT Cancellation Note  Patient Details Name: Lindsey Peterson MRN: [id number] DOB: 1975/10/16   Cancelled Treatment:    Reason Eval/Treat Not Completed: Pain limiting ability to participate.  Pt seen again to check on pain levels following medication.  Pt reported the pain meds were able to decrease pain temporarily, but is now climbing back up and is 6/10 currently.  Pt requested to be seen in AM for therapy session.  Will re-attempt evaluation in AM as medically appropriate.     Nolon Bussing, PT, DPT 03/18/21, 5:31 PM

## 2021-03-18 NOTE — Op Note (Signed)
03/18/2021  12:31 PM  PATIENT:  Lindsey Peterson  45 y.o. female  PRE-OPERATIVE DIAGNOSIS:  Primary osteoarthritis of left hip M16.12  POST-OPERATIVE DIAGNOSIS:  Primary osteoarthritis of left hip M16.12  PROCEDURE:  Procedure(s): TOTAL HIP ARTHROPLASTY ANTERIOR APPROACH (Left) APPLICATION OF CELL SAVER (N/A)  SURGEON: Leitha Schuller, MD  ASSISTANTS: None  ANESTHESIA:   spinal  EBL:  Total I/O In: 1100 [I.V.:1000; IV Piggyback:100] Out: 550 [Urine:350; Blood:200]  BLOOD ADMINISTERED: 100 CC CELLSAVER  DRAINS:  Incisional wound VAC    LOCAL MEDICATIONS USED:  MARCAINE    and OTHER Exparel  SPECIMEN: Left femoral head  DISPOSITION OF SPECIMEN:  PATHOLOGY  COUNTS:  YES  TOURNIQUET:  * No tourniquets in log *  IMPLANTS: Medacta Master lock 6 lap plus with 54 mm Mpact TM cup and liner with ceramic S 28 mm head  DICTATION: .Dragon Dictation   The patient was brought to the operating room and after spinal anesthesia was obtained patient was placed on the operative table with the ipsilateral foot into the Medacta attachment, contralateral leg on a well-padded table. C-arm was brought in and preop template x-ray taken. After prepping and draping in usual sterile fashion appropriate patient identification and timeout procedures were completed. Anterior approach to the hip was obtained and centered over the greater trochanter and TFL muscle. The subcutaneous tissue was incised hemostasis being achieved by electrocautery. TFL fascia was incised and the muscle retracted laterally deep retractor placed. The lateral femoral circumflex vessels were identified and ligated. The anterior capsule was exposed and a capsulotomy performed. The neck was identified and a femoral neck cut carried out with a saw. The head was removed without difficulty and showed sclerotic femoral head and acetabulum. Reaming was carried out to 54 mm and a 54 mm cup trial gave appropriate tightness to the acetabular  component a 54 mm DM cup was impacted into position. The leg was then externally rotated and ischiofemoral and pubofemoral releases carried out. The femur was sequentially broached to a size 6, size 6 LAT plus with S head trials were placed and the final components chosen. The 6 LAT plus stem was inserted along with a ceramic S 28 mm head and 54 mm liner. The hip was reduced and was stable the wound was thoroughly irrigated with fibrillar placed along the posterior capsule and medial neck. The deep fascia ws closed using a heavy Quill after infiltration of 30 cc of quarter percent Sensorcaine with epinephrine diluted with Exparel throughout the case, .3-0 V-loc to close the skin with skin staples.  Incisional VAC applied and patient was sent to recovery in stable condition.   PLAN OF CARE: Admit to inpatient

## 2021-03-18 NOTE — Anesthesia Procedure Notes (Signed)
Spinal  Patient location during procedure: OR Start time: 03/18/2021 10:42 AM Reason for block: surgical anesthesia Staffing Performed: resident/CRNA  Preanesthetic Checklist Completed: patient identified, IV checked, site marked, risks and benefits discussed, surgical consent, monitors and equipment checked, pre-op evaluation and timeout performed Spinal Block Patient position: sitting Prep: DuraPrep Patient monitoring: heart rate, cardiac monitor, continuous pulse ox and blood pressure Approach: midline Location: L3-4 Injection technique: single-shot Needle Needle type: Sprotte  Needle gauge: 24 G Needle length: 9 cm Assessment Sensory level: T4 Events: CSF return Additional Notes Negative heme, negative paresthesia, no pain with injection, good free flow CSF pre/post injection

## 2021-03-18 NOTE — Anesthesia Preprocedure Evaluation (Signed)
Anesthesia Evaluation  Patient identified by MRN, date of birth, ID band Patient awake    Reviewed: Allergy & Precautions, NPO status , Patient's Chart, lab work & pertinent test results  History of Anesthesia Complications Negative for: history of anesthetic complications  Airway Mallampati: III  TM Distance: >3 FB Neck ROM: Full    Dental  (+) Poor Dentition, Chipped, Dental Advisory Given   Pulmonary neg pulmonary ROS, neg sleep apnea, neg COPD, Patient abstained from smoking.Not current smoker, former smoker,    Pulmonary exam normal  + decreased breath sounds      Cardiovascular Exercise Tolerance: Poor METShypertension, (-) CAD and (-) Past MI (-) dysrhythmias  Rhythm:Regular Rate:Normal - Systolic murmurs    Neuro/Psych PSYCHIATRIC DISORDERS Anxiety negative neurological ROS     GI/Hepatic neg GERD  ,(+)     (-) substance abuse  ,   Endo/Other  diabetesMorbid obesity  Renal/GU negative Renal ROS     Musculoskeletal   Abdominal (+) + obese,   Peds  Hematology   Anesthesia Other Findings Past Medical History: No date: Anxiety No date: Diabetes mellitus without complication (HCC) No date: Dysrhythmia No date: Hypertension No date: Obesity  Reproductive/Obstetrics                             Anesthesia Physical Anesthesia Plan  ASA: 3  Anesthesia Plan: Spinal   Post-op Pain Management:    Induction: Intravenous  PONV Risk Score and Plan: 2 and Ondansetron, Dexamethasone, Propofol infusion, TIVA, Midazolam and Treatment may vary due to age or medical condition  Airway Management Planned: Natural Airway and Oral ETT  Additional Equipment: None  Intra-op Plan:   Post-operative Plan:   Informed Consent: I have reviewed the patients History and Physical, chart, labs and discussed the procedure including the risks, benefits and alternatives for the proposed anesthesia with  the patient or authorized representative who has indicated his/her understanding and acceptance.       Plan Discussed with: CRNA and Surgeon  Anesthesia Plan Comments: (Discussed R/B/A of neuraxial anesthesia technique with patient: - rare risks of spinal/epidural hematoma, nerve damage, infection - Risk of PDPH - Risk of nausea and vomiting - Risk of conversion to general anesthesia and its associated risks, including sore throat, damage to lips/teeth/oropharynx, and rare risks such as cardiac and respiratory events. - Risk of allergic reactions Patient informed about increased incidence of above perioperative risk due to high BMI. Patient understands.  )        Anesthesia Quick Evaluation

## 2021-03-18 NOTE — H&P (Signed)
Left Hip - Pain     History of the Present Illness: Lindsey Peterson is a 45 y.o. female here today.    The patient presents for H+P, left THA.   The patient states she is on her feet a lot at work. She had a cortisone injection to her left hip on 12/16/2020, which provided relief for the first few hours. She waited 2 weeks, and her pain was not improving, and was worsening. The patient states she had a few hours of relief from the numbing medication.   The patient has had 2 cesarean sections. She has never had blood clots. She has a BMI of almost 50.   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   I have reviewed past medical, surgical, social and family history, and allergies as documented in the EMR.     Past Medical History:  Past Medical History:  Diagnosis Date   Anxiety 2008   Arthritis     Asthma without status asthmaticus, unspecified     Depression 2008    prior attempts to wean unsuccessful   Diabetes mellitus without complication (CMS-HCC) 2018   Essential hypertension, benign 2011   H/O echocardiogram 07/2012    mild MR and TR, EF 65%   Hyperlipidemia, mixed 03/23/2018   Impaired fasting glucose 04/2011   Migraines     Morbid obesity (CMS-HCC)     Vertigo        Past Surgical History:  Past Surgical History:  Procedure Laterality Date   CARPAL TUNNEL RELEASE       CESAREAN SECTION        2   CHOLECYSTECTOMY   1993      Past Family History: Family History   Family History  Problem Relation Age of Onset   Obesity Mother     Osteoarthritis Mother     Diabetes type II Father     High blood pressure (Hypertension) Father     Obesity Father     Diabetes Father     Tremor Father     High blood pressure (Hypertension) Paternal Grandmother     Alzheimer's disease Paternal Grandmother     Obesity Paternal Grandmother     ALS Paternal Grandmother     No Known Problems Brother     Obesity Daughter     Alzheimer's disease  Maternal Grandmother     Diabetes type II Maternal Grandmother     Hyperlipidemia (Elevated cholesterol) Maternal Grandmother     High blood pressure (Hypertension) Maternal Grandmother     Obesity Maternal Grandmother     Diabetes Maternal Grandmother     Alzheimer's disease Paternal Grandfather     Prostate cancer Paternal Grandfather        Medications:  Current Outpatient Medications Ordered in Epic  Medication Sig Dispense Refill   atorvastatin (LIPITOR) 10 MG tablet Take 1 tablet (10 mg total) by mouth nightly 90 tablet 3   cholecalciferol (VITAMIN D3) 1,000 unit tablet Take 2,000 Units by mouth once daily       FLUoxetine (PROZAC) 40 MG capsule TAKE TWO CAPSULES BY MOUTH DAILY 180 capsule 2   hydroCHLOROthiazide (HYDRODIURIL) 50 MG tablet TAKE ONE TABLET BY MOUTH DAILY 90 tablet 2   KRILL OIL-OMEGA-3-DHA-EPA ORAL Take 1 capsule by mouth once daily       levonorgestreL (LILETTA 52 MG) 20.1 mcg/24 hrs (6 yrs) 52 mg IUD Insert 1 each into the uterus once  lisinopriL (ZESTRIL) 10 MG tablet TAKE ONE TABLET BY MOUTH DAILY 90 tablet 2   metFORMIN (GLUCOPHAGE-XR) 500 MG XR tablet Take 2 tablets (1,000 mg total) by mouth 2 (two) times daily 360 tablet 3   PAXIL 40 mg tablet Take 80 mg by mouth once daily       traMADoL (ULTRAM) 50 mg tablet TAKE ONE TABLET BY MOUTH EVERY 6 HOURS AS NEEDED FOR PAIN FOR UP TO 30 DOSES 30 tablet 0   triamcinolone 0.5 % cream Apply topically 2 (two) times daily 30 g 0   gabapentin (NEURONTIN) 300 MG capsule Take 1 capsule (300 mg total) by mouth nightly for 30 doses 30 capsule 1    No current Epic-ordered facility-administered medications on file.      Allergies:  Allergies  Allergen Reactions   Buspar [Buspirone] Headache   Sulfa (Sulfonamide Antibiotics) Hives     Body mass index is 49.65 kg/m.     Review of Systems: A comprehensive 14 point ROS was performed, reviewed, and the pertinent orthopaedic findings are documented in the HPI.       Vitals:    02/03/21 0918  BP: 132/78     General Physical Examination:    General/Constitutional: No apparent distress: well-nourished and well developed. Eyes: Pupils equal, round with synchronous movement. Lungs: Clear to auscultation HEENT: Normal Vascular: No edema, swelling or tenderness, except as noted in detailed exam. Cardiac: Heart rate and rhythm is regular. Integumentary: No impressive skin lesions present, except as noted in detailed exam. Neuro/Psych: Normal mood and affect, oriented to person, place and time.      Musculoskeletal Examination:   On exam, right hip has 30 degrees internal rotation and 45 degrees external. Left hip has 10 degrees internal rotation and 10 degrees external. Antalgic gait.     Radiographs:   AP pelvis and lateral x-rays of the left hip were ordered and personally reviewed today. These show complete loss of superior joint space, some slight subluxation, cyst formation in the acetabulum on the AP view. Lateral view shows global loss of joint space. There are osteophytes posterior and anterior femoral neck.   X-ray Impression Severe advanced osteoarthritis, complete joint space loss.     Assessment:     ICD-10-CM  1. Primary osteoarthritis of left hip M16.12        Plan:   The patient has clinical findings of severe advanced osteoarthritis of the left hip with complete joint space loss.   Surgery scheduled for 03/18/21     Surgical Risks:   The nature of the condition and the proposed procedure has been reviewed in detail with the patient. Surgical versus non-surgical options and prognosis for recovery have been reviewed and the inherent risks and benefits of each have been discussed including the risks of infection, bleeding, injury to nerves/blood vessels/tendons, incomplete relief of symptoms, persisting pain and/or stiffness, loss of function, complex regional pain syndrome, failure of the procedure, as appropriate.       Electronically signed by Marlena Clipper, MD at 03/03/2021 8:19 AM EDT   Reviewed  H+P. No changes noted.

## 2021-03-19 ENCOUNTER — Encounter: Payer: Self-pay | Admitting: Orthopedic Surgery

## 2021-03-19 LAB — GLUCOSE, CAPILLARY
Glucose-Capillary: 124 mg/dL — ABNORMAL HIGH (ref 70–99)
Glucose-Capillary: 137 mg/dL — ABNORMAL HIGH (ref 70–99)
Glucose-Capillary: 136 mg/dL — ABNORMAL HIGH (ref 70–99)
Glucose-Capillary: 149 mg/dL — ABNORMAL HIGH (ref 70–99)

## 2021-03-19 NOTE — Anesthesia Postprocedure Evaluation (Signed)
Anesthesia Post Note  Patient: Lindsey Peterson  Procedure(s) Performed: TOTAL HIP ARTHROPLASTY ANTERIOR APPROACH (Left: Hip) APPLICATION OF CELL SAVER  Patient location during evaluation: Nursing Unit Anesthesia Type: Spinal Level of consciousness: oriented and awake and alert Pain management: pain level controlled Vital Signs Assessment: post-procedure vital signs reviewed and stable Respiratory status: spontaneous breathing and respiratory function stable Cardiovascular status: blood pressure returned to baseline and stable Postop Assessment: no headache, no backache, no apparent nausea or vomiting and patient able to bend at knees Anesthetic complications: no   No notable events documented.   Last Vitals:  Vitals:   03/18/21 2331 03/19/21 0532  BP: 116/71 122/68  Pulse: 82 82  Resp: 18 20  Temp: 36.8 C 36.9 C  SpO2:  100%    Last Pain:  Vitals:   03/19/21 0704  TempSrc:   PainSc: 6                  Tanicka Bisaillon B Alonza Smoker

## 2021-03-19 NOTE — Evaluation (Signed)
Physical Therapy Evaluation Patient Details Name: Lindsey Peterson MRN: [id number] DOB: 1976/06/02 Today's Date: 03/19/2021   History of Present Illness  Pt is a 45 y/o F with PMH: DM, arthritis and HLD who underwent elective anterior L THA on 8/4 [name]).    Clinical Impression  Pt received seated in recliner upon arrival to room.  Pt agreeable to therapy.  Pt states that the recliner was uncomfortable and would prefer to return to bed following the conclusion of therapy.  Pt moves well with transfers, however only able to ambulate short distances before requiring standing rest breaks.  Pt notes that she started to feel clammy and needed to return to the bed.  Pt able to transfer safely back to the bed where she was left with all needs met.  Pt husband and son arrived to room at conclusion of therapy.  Pt will benefit from skilled PT intervention to increase independence and safety with basic mobility in preparation for discharge to the venue listed below.  Anticipate d/c home with HHPT with progression of mobility within the hospital.       Follow Up Recommendations Home health PT;Supervision for mobility/OOB    Equipment Recommendations  Rolling walker with 5" wheels;3in1 (PT)    Recommendations for Other Services       Precautions / Restrictions Precautions Precautions: Fall Restrictions Weight Bearing Restrictions: Yes LLE Weight Bearing: Weight bearing as tolerated Other Position/Activity Restrictions: anterior, no "fencing" pose      Mobility  Bed Mobility Overal bed mobility: Needs Assistance Bed Mobility: Sit to Supine     Supine to sit: Min guard;Min assist;HOB elevated Sit to supine: Mod assist   General bed mobility comments: modA needed for L LE navigation into the bed    Transfers Overall transfer level: Needs assistance Equipment used: Rolling walker (2 wheeled) (bari) Transfers: Sit to/from Stand Sit to Stand: From elevated surface;Min guard          General transfer comment: pt performed well with verbal cuing for hand placement and pushing through one UE and using one on the walker.  Ambulation/Gait Ambulation/Gait assistance: Min guard Gait Distance (Feet): 60 Feet Assistive device: Rolling walker (2 wheeled) Gait Pattern/deviations: Step-to pattern;Decreased step length - right;Decreased stance time - left;Decreased stride length;Antalgic Gait velocity: decreased   General Gait Details: Pt demonstrates antalgic gait pattern with decreased stance time on the L and decreased stride length overall.  Stairs            Wheelchair Mobility    Modified Rankin (Stroke Patients Only)       Balance Overall balance assessment: Needs assistance   Sitting balance-Leahy Scale: Good       Standing balance-Leahy Scale: Fair Standing balance comment: Pt uses heavy UE assistance in order to alleviate pressure on the L LE.                             Pertinent Vitals/Pain Pain Assessment: 0-10 Pain Score: 6  Pain Location: L hip Pain Descriptors / Indicators: Grimacing;Guarding Pain Intervention(s): Limited activity within patient's tolerance;Monitored during session;Premedicated before session;Repositioned;Ice applied    Home Living Family/patient expects to be discharged to:: Private residence Living Arrangements: Spouse/significant other;Children (two teenage children, son is old enough to drive) Available Help at Discharge: Family Type of Home: House Home Access: Stairs to enter Entrance Stairs-Rails: Right Entrance Stairs-Number of Steps: 3 Home Layout: Two level;Able to live on main level with bedroom/bathroom  Home Equipment: [place] - 2 wheels;Cane - single point      Prior Function Level of Independence: Independent         Comments: working, driving, caring for her children. No AD use for fxl mobility, able to perform all I/ADLs I'ly.     Hand Dominance   Dominant Hand: Right     Extremity/Trunk Assessment   Upper Extremity Assessment Upper Extremity Assessment: Defer to OT evaluation    Lower Extremity Assessment Lower Extremity Assessment: RLE deficits/detail;LLE deficits/detail RLE Deficits / Details: some baseline limitations with hip flexion and internal/external rotation, pt endorsing that LB dressing is difficult at baseline. RLE: Unable to fully assess due to pain LLE Deficits / Details: expected post-op limitations with hip mobility as it pertains to ADLs.       Communication   Communication: No difficulties  Cognition Arousal/Alertness: Awake/alert Behavior During Therapy: WFL for tasks assessed/performed Overall Cognitive Status: Within Functional Limits for tasks assessed                                        General Comments      Exercises Other Exercises Other Exercises: OT ed re: role of OT in acute setting, bathing considerations post-op, compression stocking mgt, safe use of 2WW, safety considerations with dizziness in prolonged standing including sitting for 30 secs before standing, standing 30 secs before amb, pumping ankles to increase circulation. Other Exercises: Pt educated on roles of PT and services provided during hospital stay.  Pt also encouraged to perform exercises in between therapy sessions in order to prevent muscle atrophy.   Assessment/Plan    PT Assessment Patient needs continued PT services  PT Problem List Decreased strength;Decreased range of motion;Decreased activity tolerance;Decreased balance;Decreased mobility;Decreased knowledge of use of DME;Decreased safety awareness;Pain       PT Treatment Interventions DME instruction;Gait training;Stair training;Functional mobility training;Therapeutic activities;Therapeutic exercise;Balance training    PT Goals (Current goals can be found in the Care Plan section)  Acute Rehab PT Goals Patient Stated Goal: to get stronger, get up and moving more, and  go home PT Goal Formulation: With patient Time For Goal Achievement: 04/02/21 Potential to Achieve Goals: Good    Frequency BID   Barriers to discharge        Co-evaluation               AM-PAC PT "6 Clicks" Mobility  Outcome Measure Help needed turning from your back to your side while in a flat bed without using bedrails?: A Little Help needed moving from lying on your back to sitting on the side of a flat bed without using bedrails?: A Little Help needed moving to and from a bed to a chair (including a wheelchair)?: A Little Help needed standing up from a chair using your arms (e.g., wheelchair or bedside chair)?: A Little Help needed to walk in hospital room?: A Lot Help needed climbing 3-5 steps with a railing? : A Lot 6 Click Score: 16    End of Session Equipment Utilized During Treatment: Gait belt Activity Tolerance: Patient tolerated treatment well;Patient limited by pain Patient left: in bed;with call bell/phone within reach;with bed alarm set;with family/visitor present Nurse Communication: Mobility status PT Visit Diagnosis: Unsteadiness on feet (R26.81);Other abnormalities of gait and mobility (R26.89);Muscle weakness (generalized) (M62.81);Difficulty in walking, not elsewhere classified (R26.2);Pain Pain - Right/Left: Left Pain - part of body: Hip  Time: 4446-1901 PT Time Calculation (min) (ACUTE ONLY): 35 min   Charges:   PT Evaluation $PT Eval Low Complexity: 1 Low PT Treatments $Gait Training: 23-37 mins        Gwenlyn Saran, PT, DPT 03/19/21, 1:34 PM   Christie Nottingham 03/19/2021, 1:29 PM

## 2021-03-19 NOTE — Progress Notes (Signed)
   Subjective: 1 Day Post-Op Procedure(s) (LRB): TOTAL HIP ARTHROPLASTY ANTERIOR APPROACH (Left) APPLICATION OF CELL SAVER (N/A) Patient reports pain as 8 on 0-10 scale.   Patient is well, and has had no acute complaints or problems Denies any CP, SOB, ABD pain. We will continue therapy today.  Plan is to go Home after hospital stay.  Objective: Vital signs in last 24 hours: Temp:  [96.9 F (36.1 C)-98.9 F (37.2 C)] 98.9 F (37.2 C) (08/05 0738) Pulse Rate:  [74-86] 81 (08/05 0738) Resp:  [11-20] 18 (08/05 0738) BP: (90-150)/(52-91) 132/75 (08/05 0738) SpO2:  [97 %-100 %] 100 % (08/05 0738)  Intake/Output from previous day: 08/04 0701 - 08/05 0700 In: 1849.8 [I.V.:1649.8; IV Piggyback:200] Out: 1600 [Urine:1400; Blood:200] Intake/Output this shift: No intake/output data recorded.  Recent Labs    03/18/21 1515  HGB 10.2*   Recent Labs    03/18/21 1515  WBC 16.7*  RBC 3.24*  HCT 30.1*  PLT 321   Recent Labs    03/18/21 1515  CREATININE 0.79   No results for input(s): LABPT, INR in the last 72 hours.  EXAM General - Patient is Alert, Appropriate, and Oriented Extremity - Neurovascular intact Sensation intact distally Intact pulses distally Dorsiflexion/Plantar flexion intact No cellulitis present Compartment soft Dressing - dressing C/D/I and no drainage Praveena intact without drainage Motor Function - intact, moving foot and toes well on exam.   Past Medical History:  Diagnosis Date   Anxiety    Diabetes mellitus without complication (HCC)    Dysrhythmia    Hypertension    Obesity     Assessment/Plan:   1 Day Post-Op Procedure(s) (LRB): TOTAL HIP ARTHROPLASTY ANTERIOR APPROACH (Left) APPLICATION OF CELL SAVER (N/A) Active Problems:   Status post total hip replacement, left  Estimated body mass index is 49.52 kg/m as calculated from the following:   Height as of this encounter: 5\' 7"  (1.702 m).   Weight as of 03/08/21: 143.4 kg. Advance  diet Up with therapy Work on bowel movement Vital signs stable Pain at a 10, continue with current pain regimen Care management to consist with discharge to home with home health PT   DVT Prophylaxis - Lovenox, Foot Pumps, and TED hose Weight-Bearing as tolerated to left leg   T. 03/10/21, PA-C [facility] 03/19/2021, 7:52 AM

## 2021-03-19 NOTE — TOC Progression Note (Addendum)
Transition of Care [facility]) - Progression Note    Patient Details  Name: Lindsey Peterson MRN: [id number] Date of Birth: 15-May-1976  [facility]) CM/SW Contact  Caryn Section, RN Phone Number: 03/19/2021, 3:49 PM  Clinical Narrative:   Patient is set up with [facility] with [facility], confirmed per [place]   Addendum:  Patient lives at home with family, who can assist her on discharge if needed.  She has no concerns about transport to appointments or obtaining medications.  Patient reports she takes medications as directed.    Patient accepts walker and 3n1 to be delivered to home and accepts [facility] as HH agency.  She states she has no further needs at this time.  TOC contact information given.   Addendum:  Bariatric 3n1 to be drop shipped as per [employer].    Expected Discharge Plan and Services                                                 Social Determinants of Health (SDOH) Interventions    Readmission Risk Interventions No flowsheet data found.

## 2021-03-19 NOTE — Progress Notes (Signed)
Physical Therapy Treatment Patient Details Name: Lindsey Peterson MRN: [id number] DOB: Jan 19, 1976 Today's Date: 03/19/2021    History of Present Illness Pt is a 45 y/o F with PMH: DM, arthritis and HLD who underwent elective anterior L THA on 8/4 Rosita Kea).    PT Comments    Pt received in supine position and agreeable to therapy.  Pt notes she just got back to bed from the bathroom and requested not to get back up.  Education provided on precautions with anterior hip and pt performed bed-level exercises for increase strengthening of the LE's.  Pt unable to perform SLR on the L LE due to weakness, however is able to perform with AAROM from the therapist.  Pt will continue to be monitored for strengthening and mobility through hospital stay.  Current discharge plans to HHPT remain appropriate at this time.  Pt will continue to benefit from skilled therapy in order to address deficits listed below.      Follow Up Recommendations  Home health PT;Supervision for mobility/OOB     Equipment Recommendations  Rolling walker with 5" wheels;3in1 (PT)    Recommendations for Other Services       Precautions / Restrictions Precautions Precautions: Fall Restrictions Weight Bearing Restrictions: Yes LLE Weight Bearing: Weight bearing as tolerated Other Position/Activity Restrictions: anterior, no "fencing" pose    Mobility  Bed Mobility Overal bed mobility: Needs Assistance Bed Mobility: Sit to Supine       Sit to supine: Mod assist   General bed mobility comments: pt requested not to get out of bed during PM session due to fatigue and already getting up to use the restroom.    Transfers Overall transfer level: Needs assistance Equipment used: Rolling walker (2 wheeled) (bari) Transfers: Sit to/from Stand Sit to Stand: From elevated surface;Min guard         General transfer comment: pt performed well with verbal cuing for hand placement and pushing through one UE and using one on  the walker.  Ambulation/Gait Ambulation/Gait assistance: Min guard Gait Distance (Feet): 60 Feet Assistive device: Rolling walker (2 wheeled) Gait Pattern/deviations: Step-to pattern;Decreased step length - right;Decreased stance time - left;Decreased stride length;Antalgic Gait velocity: decreased   General Gait Details: Pt demonstrates antalgic gait pattern with decreased stance time on the L and decreased stride length overall.   Stairs             Wheelchair Mobility    Modified Rankin (Stroke Patients Only)       Balance Overall balance assessment: Needs assistance   Sitting balance-Leahy Scale: Good       Standing balance-Leahy Scale: Fair Standing balance comment: Pt uses heavy UE assistance in order to alleviate pressure on the L LE.                            Cognition Arousal/Alertness: Awake/alert Behavior During Therapy: WFL for tasks assessed/performed Overall Cognitive Status: Within Functional Limits for tasks assessed                                        Exercises Total Joint Exercises Ankle Circles/Pumps: AROM;Strengthening;Both;10 reps;Supine Quad Sets: AROM;Strengthening;Both;10 reps;Supine Gluteal Sets: AROM;Strengthening;Both;10 reps;Supine Heel Slides: AROM;Strengthening;Both;10 reps;Supine Hip ABduction/ADduction: AROM;Strengthening;Both;10 reps;Supine Straight Leg Raises: AROM;Right;AAROM;Left;Strengthening;10 reps;Supine Other Exercises Other Exercises: Pt educated on roles of PT and services provided during hospital stay.  Pt also encouraged to perform exercises in between therapy sessions in order to prevent muscle atrophy.    General Comments        Pertinent Vitals/Pain Pain Assessment: 0-10 Pain Score: 5  Pain Location: L hip Pain Descriptors / Indicators: Grimacing;Guarding Pain Intervention(s): Limited activity within patient's tolerance;Monitored during session;Premedicated before  session;Repositioned;Ice applied    Home Living Family/patient expects to be discharged to:: Private residence Living Arrangements: Spouse/significant other;Children (two teenage children, son is old enough to drive) Available Help at Discharge: Family Type of Home: House Home Access: Stairs to enter Entrance Stairs-Rails: Right Home Layout: Two level;Able to live on main level with bedroom/bathroom Home Equipment: [place] - 2 wheels;Cane - single point      Prior Function Level of Independence: Independent      Comments: working, driving, caring for her children. No AD use for fxl mobility, able to perform all I/ADLs I'ly.   PT Goals (current goals can now be found in the care plan section) Acute Rehab PT Goals Patient Stated Goal: to get stronger, get up and moving more, and go home PT Goal Formulation: With patient Time For Goal Achievement: 04/02/21 Potential to Achieve Goals: Good Progress towards PT goals: Progressing toward goals    Frequency    BID      PT Plan Current plan remains appropriate    Co-evaluation              AM-PAC PT "6 Clicks" Mobility   Outcome Measure  Help needed turning from your back to your side while in a flat bed without using bedrails?: A Little Help needed moving from lying on your back to sitting on the side of a flat bed without using bedrails?: A Little Help needed moving to and from a bed to a chair (including a wheelchair)?: A Little Help needed standing up from a chair using your arms (e.g., wheelchair or bedside chair)?: A Little Help needed to walk in hospital room?: A Lot Help needed climbing 3-5 steps with a railing? : A Lot 6 Click Score: 16    End of Session Equipment Utilized During Treatment: Gait belt Activity Tolerance: Patient tolerated treatment well;Patient limited by pain Patient left: in bed;with call bell/phone within reach;with bed alarm set;with family/visitor present Nurse Communication: Mobility  status PT Visit Diagnosis: Unsteadiness on feet (R26.81);Other abnormalities of gait and mobility (R26.89);Muscle weakness (generalized) (M62.81);Difficulty in walking, not elsewhere classified (R26.2);Pain Pain - Right/Left: Left Pain - part of body: Hip     Time: 2440-1027 PT Time Calculation (min) (ACUTE ONLY): 10 min  Charges:  $Gait Training: 23-37 mins $Therapeutic Exercise: 8-22 mins                     Nolon Bussing, PT, DPT 03/19/21, 5:08 PM    Phineas Real 03/19/2021, 5:01 PM

## 2021-03-19 NOTE — Evaluation (Signed)
Occupational Therapy Evaluation Patient Details Name: Lindsey Peterson MRN: [id number] DOB: 09-16-1975 Today's Date: 03/19/2021    History of Present Illness Pt is a 45 y/o F with PMH: DM, arthritis and HLD who underwent elective anterior L THA on 8/4 [name]).   Clinical Impression   Pt seen for OT evaluation this date in setting of acute hospitalization s/p elective L THA. Pt reports 7/10 pain this AM. RN reporting pt received one analgesic ~45 mins prior to start of session. Pt agreeable to session and getting OOB. Pt requires MIN A to come to EOB sitting with HOB elevated and use of bed rails. Pt demos G static sitting balance. Pt requires MIN A to CTS from elevated surface with use of bariatric front wheel walker and cues for safe sequencing. Pt able to perform fxl mobility to restroom with CGA/MIN A and requires CGA for STS to/from commode with use of grab bar. Pt demos F static standing balance, able to alternate hands to complete standing g/h tasks including washing hands and oral care. Pt performs fxl mobility ~10' to recliner in room. Left with all needs met and in reach, chair alarm set. RN notified of session contents. Anticipate pt will be able to return to home with [facility] f/u services to ensure safety with ADLs/ADL mobility in the natural environment.     Follow Up Recommendations  Home health OT;Supervision - Intermittent    Equipment Recommendations  3 in 1 bedside commode;Tub/shower seat (bariatric BSC)    Recommendations for Other Services       Precautions / Restrictions Precautions Precautions: Fall Restrictions Weight Bearing Restrictions: Yes LLE Weight Bearing: Weight bearing as tolerated Other Position/Activity Restrictions: anterior, no "fencing" pose      Mobility Bed Mobility Overal bed mobility: Needs Assistance Bed Mobility: Supine to Sit     Supine to sit: Min guard;Min assist;HOB elevated     General bed mobility comments: increased time, assist to  square hips    Transfers Overall transfer level: Needs assistance Equipment used: Rolling walker (2 wheeled) (bari) Transfers: Sit to/from Stand Sit to Stand: Min assist;From elevated surface         General transfer comment: MIN A from elevated surface on initial trial, progress to CGA/MIN A to STS from bedside commode (benefits from use of grab bar to pull up versus pushing to stand from EOB sitting)    Balance Overall balance assessment: Needs assistance   Sitting balance-Leahy Scale: Good       Standing balance-Leahy Scale: Fair Standing balance comment: UE support on RW to alleviate pain and balance                           ADL either performed or assessed with clinical judgement   ADL Overall ADL's : Needs assistance/impaired                                       General ADL Comments: CGA/MIN A for ADL transfers and mobility with bari RW, MOD A for LB dressing, SETUP to INDEP for UB ADLs seated, SBA for standing UB ADLs sink-side for safety/balance.     Vision Patient Visual Report: No change from baseline       Perception     Praxis      Pertinent Vitals/Pain Pain Assessment: 0-10 Pain Score: 7  Pain Location: L hip  Pain Descriptors / Indicators: Grimacing;Guarding Pain Intervention(s): Limited activity within patient's tolerance;Monitored during session;Repositioned;Premedicated before session     Hand Dominance     Extremity/Trunk Assessment Upper Extremity Assessment Upper Extremity Assessment: Overall WFL for tasks assessed (ROM WFL, MMT grossly 4/5. Strength appropriate, but could be stronger to assist with push to stand in setting of being s/p L THA)   Lower Extremity Assessment Lower Extremity Assessment: RLE deficits/detail;LLE deficits/detail RLE Deficits / Details: some baseline limitations with hip flexion and internal/external rotation, pt endorsing that LB dressing is difficult at baseline. RLE: Unable to fully  assess due to pain LLE Deficits / Details: expected post-op limitations with hip mobility as it pertains to ADLs.       Communication Communication Communication: No difficulties   Cognition Arousal/Alertness: Awake/alert Behavior During Therapy: WFL for tasks assessed/performed Overall Cognitive Status: Within Functional Limits for tasks assessed                                     General Comments       Exercises Other Exercises Other Exercises: OT ed re: role of OT in acute setting, bathing considerations post-op, compression stocking mgt, safe use of 2WW, safety considerations with dizziness in prolonged standing including sitting for 30 secs before standing, standing 30 secs before amb, pumping ankles to increase circulation.   Shoulder Instructions      Home Living Family/patient expects to be discharged to:: Private residence Living Arrangements: Spouse/significant other;Children (two teenage children, son is old enough to drive) Available Help at Discharge: Family Type of Home: House Home Access: Stairs to enter [employer] of Steps: 3 Entrance Stairs-Rails: Right Home Layout: Two level;Able to live on main level with bedroom/bathroom Alternate Level Stairs-Number of Steps: flight             Home Equipment: Walker - 2 wheels;Cane - single point          Prior Functioning/Environment Level of Independence: Independent        Comments: working, driving, caring for her children. No AD use for fxl mobility, able to perform all I/ADLs I'ly.        OT Problem List: Decreased strength;Decreased range of motion;Decreased activity tolerance;Impaired balance (sitting and/or standing);Decreased knowledge of use of DME or AE;Pain;Increased edema      OT Treatment/Interventions: Self-care/ADL training;DME and/or AE instruction;Therapeutic activities;Balance training;Therapeutic exercise;Patient/family education    OT Goals(Current goals  can be found in the care plan section) Acute Rehab OT Goals Patient Stated Goal: to get stronger, get up and moving more, and go home OT Goal Formulation: With patient Time For Goal Achievement: 04/02/21 Potential to Achieve Goals: Good ADL Goals Pt Will Perform Lower Body Dressing: with min guard assist;sit to/from stand;with adaptive equipment Pt Will Transfer to Toilet: with supervision;ambulating;bedside commode (with RW to restroom with BSC over commode to elevate) Pt Will Perform Toileting - Clothing Manipulation and hygiene: with supervision;sit to/from stand Pt Will Perform Tub/Shower Transfer: with supervision;ambulating;shower seat;grab bars;rolling walker Pt/caregiver will Perform Home Exercise Program: Increased strength;Both right and left upper extremity;With Supervision  OT Frequency: Min 1X/week   Barriers to D/C:            Co-evaluation              AM-PAC OT "6 Clicks" Daily Activity     Outcome Measure Help from another person eating meals?: None Help from another person taking  care of personal grooming?: A Little Help from another person toileting, which includes using toliet, bedpan, or urinal?: A Little Help from another person bathing (including washing, rinsing, drying)?: A Little Help from another person to put on and taking off regular upper body clothing?: None Help from another person to put on and taking off regular lower body clothing?: A Little 6 Click Score: 20   End of Session Equipment Utilized During Treatment: Gait belt;Rolling walker Nurse Communication: Mobility status;Other (comment) (pain levels)  Activity Tolerance: Patient tolerated treatment well Patient left: in chair;with call bell/phone within reach;with chair alarm set  OT Visit Diagnosis: Unsteadiness on feet (R26.81);Muscle weakness (generalized) (M62.81);Pain Pain - Right/Left: Left Pain - part of body: Hip                Time: 0300-9233 OT Time Calculation (min): 59  min Charges:  OT General Charges $OT Visit: 1 Visit OT Evaluation $OT Eval Low Complexity: 1 Low OT Treatments $Self Care/Home Management : 23-37 mins $Therapeutic Activity: 8-22 mins  Gerrianne Scale, MS, OTR/L ascom [phone] 03/19/21, 12:12 PM

## 2021-03-20 LAB — GLUCOSE, CAPILLARY
Glucose-Capillary: 144 mg/dL — ABNORMAL HIGH (ref 70–99)
Glucose-Capillary: 120 mg/dL — ABNORMAL HIGH (ref 70–99)
Glucose-Capillary: 121 mg/dL — ABNORMAL HIGH (ref 70–99)
Glucose-Capillary: 135 mg/dL — ABNORMAL HIGH (ref 70–99)

## 2021-03-20 LAB — C DIFFICILE QUICK SCREEN W PCR REFLEX
C Diff antigen: NEGATIVE
C Diff interpretation: NOT DETECTED
C Diff toxin: NEGATIVE

## 2021-03-20 MED ORDER — METHOCARBAMOL 500 MG PO TABS: 500.0000 mg | ORAL_TABLET | Freq: Four times a day (QID) | ORAL | 0 refills | Status: AC | PRN

## 2021-03-20 MED ORDER — ENOXAPARIN SODIUM 40 MG/0.4ML IJ SOSY: 40.0000 mg | PREFILLED_SYRINGE | INTRAMUSCULAR | 0 refills | Status: AC

## 2021-03-20 MED ORDER — PAROXETINE HCL 20 MG PO TABS
80.0000 mg | ORAL_TABLET | Freq: Every day | ORAL | Status: DC
  Administered 2021-03-20 – 2021-03-21 (×2): 80 mg via ORAL
  Filled 2021-03-20 (×2): qty 4

## 2021-03-20 MED ORDER — LOPERAMIDE HCL 2 MG PO CAPS: 2.0000 mg | ORAL_CAPSULE | ORAL | Status: DC | PRN

## 2021-03-20 MED ORDER — TRAMADOL HCL 50 MG PO TABS: 50.0000 mg | ORAL_TABLET | Freq: Four times a day (QID) | ORAL | 0 refills | Status: AC | PRN

## 2021-03-20 MED ORDER — OXYCODONE HCL 5 MG PO TABS: 5.0000 mg | ORAL_TABLET | ORAL | 0 refills | Status: AC | PRN

## 2021-03-20 NOTE — Progress Notes (Signed)
Physical Therapy Treatment Patient Details Name: Lindsey Peterson MRN: [id number] DOB: 1976/03/31 Today's Date: 03/20/2021    History of Present Illness Pt is a 45 y/o F with PMH: DM, arthritis and HLD who underwent elective anterior L THA on 8/4 Rosita Kea).    PT Comments     Pt was long sitting in bed upon arriving. Per RN staff, not on enteric/contact precautions until C-diff ruled out. Pt continues to be having frequent loose stools. Upon entering room, pt requested to get up to BR. Pt required min assist to enter/exit bed. Once in sitting pt is able to easily stand and ambulate without physical assistance. She successfully urinated however was unable to have BM. Returned to bed and reviewed precautions and HEP. Author brought step into pt's room and demonstrated proper stair performance however pt requested to wait until morning to perform stairs. Author demonstrated both backwards technique and BUE on +1 railing technique. Will perform perform in AM session. Recommend DC home with HHPT when deemed medically stable.    Follow Up Recommendations  Home health PT;Supervision for mobility/OOB     Equipment Recommendations  Other (comment) (pt has personal bariatric RW in room. BSC being delivered to home.)       Precautions / Restrictions Precautions Precautions: Fall;Anterior Hip Precaution Booklet Issued: Yes (comment) Restrictions Weight Bearing Restrictions: Yes LLE Weight Bearing: Weight bearing as tolerated Other Position/Activity Restrictions: anterior, no "fencing" pose    Mobility  Bed Mobility Overal bed mobility: Needs Assistance Bed Mobility: Supine to Sit;Sit to Supine     Supine to sit: Min assist;HOB elevated Sit to supine: Min assist;HOB elevated   General bed mobility comments: pt requires min assist to exit and re-enter bed.    Transfers Overall transfer level: Needs assistance Equipment used: Rolling walker (2 wheeled) (bariatric) Transfers: Sit to/from  Stand Sit to Stand: Supervision;From elevated surface         General transfer comment: pt stood from EOB and from elevated toilet height without physical assistance or vcs  Ambulation/Gait Ambulation/Gait assistance: Supervision Gait Distance (Feet): 15 Feet Assistive device: Rolling walker (2 wheeled) Gait Pattern/deviations: Decreased stance time - left;Step-through pattern;Decreased step length - right;Antalgic Gait velocity: decreased   General Gait Details: pt was able to demonstrate safe steady gait kinematics with ambulation to/from BR. no LOB or safety concern. Distance limited to in room until pt cleared form c-diff/isolation precautions   Stairs Stairs: Yes   Stair Management: No rails;Backwards;One rail Right Number of Stairs: 1 General stair comments: Pt has 3-4 steps in garage to enter home. unable to ambulate to rehab gym currently due to have very frequent loose BMs.(had 5 BMs in last 30 minutes) Author did bring step into pt's room and demonstrated proper performance with going backwards and educated pt on performance with +1 R rail. will address tomorrow in AM session. pt hopeful to DC after AM session       Balance Overall balance assessment: Needs assistance Sitting-balance support: Feet supported Sitting balance-Leahy Scale: Good Sitting balance - Comments: no balance deficits in sitting   Standing balance support: Bilateral upper extremity supported;During functional activity Standing balance-Leahy Scale: Good Standing balance comment: no balance deficits with BUE support        Cognition Arousal/Alertness: Awake/alert Behavior During Therapy: WFL for tasks assessed/performed Overall Cognitive Status: Within Functional Limits for tasks assessed      General Comments: Pt is A and O x 4 and extremely pleasant      Exercises Total  Joint Exercises Ankle Circles/Pumps: AROM;Strengthening;Both;10 reps;Supine Quad Sets: AROM;Strengthening;Both;10  reps;Supine Gluteal Sets: AROM;Strengthening;Both;10 reps;Supine Heel Slides: AROM;Strengthening;Both;10 reps;Supine Hip ABduction/ADduction: AROM;Strengthening;Both;10 reps;Supine Straight Leg Raises: AAROM;Left;Strengthening;10 reps;Supine        Pertinent Vitals/Pain Pain Assessment: 0-10 Pain Score: 4  Pain Location: L hip Pain Descriptors / Indicators: Grimacing;Guarding Pain Intervention(s): Limited activity within patient's tolerance;Monitored during session;Premedicated before session;Repositioned     PT Goals (current goals can now be found in the care plan section) Acute Rehab PT Goals Patient Stated Goal: go home when safe to Progress towards PT goals: Progressing toward goals    Frequency    BID      PT Plan Current plan remains appropriate       AM-PAC PT "6 Clicks" Mobility   Outcome Measure  Help needed turning from your back to your side while in a flat bed without using bedrails?: A Little Help needed moving from lying on your back to sitting on the side of a flat bed without using bedrails?: A Little Help needed moving to and from a bed to a chair (including a wheelchair)?: A Little Help needed standing up from a chair using your arms (e.g., wheelchair or bedside chair)?: A Little Help needed to walk in hospital room?: A Little Help needed climbing 3-5 steps with a railing? : A Little 6 Click Score: 18    End of Session   Activity Tolerance: Patient tolerated treatment well;Other (comment) (limited by frequent loose BMs) Patient left: in bed;with call bell/phone within reach;with bed alarm set;with family/visitor present Nurse Communication: Mobility status PT Visit Diagnosis: Unsteadiness on feet (R26.81);Other abnormalities of gait and mobility (R26.89);Muscle weakness (generalized) (M62.81);Difficulty in walking, not elsewhere classified (R26.2);Pain Pain - Right/Left: Left Pain - part of body: Hip     Time: 1407-1430 PT Time Calculation  (min) (ACUTE ONLY): 23 min  Charges:  $Gait Training: 8-22 mins $Therapeutic Exercise: 8-22 mins                    Jetta Lout PTA 03/20/21, 3:26 PM

## 2021-03-20 NOTE — Discharge Instructions (Signed)

## 2021-03-20 NOTE — Discharge Summary (Signed)
Physician Discharge Summary  Patient ID: Lindsey Peterson MRN: [id number] DOB/AGE: 46/16/1977 44 y.o.  Admit date: 03/18/2021 Discharge date: 03/21/2021  Admission Diagnoses:  Status post total hip replacement, left [Z96.642]   Discharge Diagnoses: Patient Active Problem List   Diagnosis Date Noted   Status post total hip replacement, left 03/18/2021    Past Medical History:  Diagnosis Date   Anxiety    Diabetes mellitus without complication (HCC)    Dysrhythmia    Hypertension    Obesity      Transfusion: None   Consultants (if any):   Discharged Condition: Improved  Hospital Course: Lindsey Peterson is an 45 y.o. female who was admitted 03/18/2021 with a diagnosis of left hip osteoarthritis and went to the operating room on 03/18/2021 and underwent the above named procedures.    Surgeries: Procedure(s): TOTAL HIP ARTHROPLASTY ANTERIOR APPROACH APPLICATION OF CELL SAVER on 03/18/2021 Patient tolerated the surgery well. Taken to PACU where she was stabilized and then transferred to the orthopedic floor.  Started on Lovenox 40 mg q 24 hrs. Foot pumps applied bilaterally at 80 mm. Heels elevated on bed with rolled towels. No evidence of DVT. Negative Homan. Physical therapy started on day #1 for gait training and transfer. OT started day #1 for ADL and assisted devices.  Patient's foley was d/c on day #1. Patient's IV was d/c on day #2.  On post op day #3 patient was stable and ready for discharge to home with home health PT.  Implants: Medacta Master lock 6 lap plus with 54 mm Mpact TM cup and liner with ceramic S 28 mm head  She was given perioperative antibiotics:  Anti-infectives (From admission, onward)    Start     Dose/Rate Route Frequency Ordered Stop   03/18/21 0600  ceFAZolin (ANCEF) IVPB 3g/100 mL premix  Status:  Discontinued        3 g 200 mL/hr over 30 Minutes Intravenous On call to O.R. 03/18/21 0301 03/18/21 0302   03/18/21 0600  ceFAZolin (ANCEF) IVPB  3g/100 mL premix        3 g 200 mL/hr over 30 Minutes Intravenous On call to O.R. 03/18/21 0301 03/18/21 1101     .  She was given sequential compression devices, early ambulation, and Lovenox, teds for DVT prophylaxis.  She benefited maximally from the hospital stay and there were no complications.    Recent vital signs:  Vitals:   03/21/21 0346 03/21/21 0735  BP: 129/61 114/65  Pulse: 100 93  Resp: 16   Temp: 98.8 F (37.1 C) 99.1 F (37.3 C)  SpO2: 95% 99%    Recent laboratory studies:  Lab Results  Component Value Date   HGB 10.2 (L) 03/18/2021   HGB 11.1 (L) 03/08/2021   HGB 12.0 09/06/2019   Lab Results  Component Value Date   WBC 16.7 (H) 03/18/2021   PLT 321 03/18/2021   No results found for: INR Lab Results  Component Value Date   NA 137 03/08/2021   K 3.6 03/08/2021   CL 101 03/08/2021   CO2 25 03/08/2021   BUN 22 (H) 03/08/2021   CREATININE 0.79 03/18/2021   GLUCOSE 109 (H) 03/08/2021    Discharge Medications:   Allergies as of 03/21/2021       Reactions   Sulfa Antibiotics Hives   Morphine Rash   Face turned red        Medication List     STOP taking these medications  azithromycin 250 MG tablet Commonly known as: Zithromax Z-Pak   etodolac 500 MG tablet Commonly known as: LODINE   orphenadrine 100 MG tablet Commonly known as: NORFLEX   oxyCODONE-acetaminophen 5-325 MG tablet Commonly known as: PERCOCET/ROXICET   oxyCODONE-acetaminophen 7.5-325 MG tablet Commonly known as: Percocet   PARoxetine 40 MG tablet Commonly known as: Paxil       TAKE these medications    atorvastatin 10 MG tablet Commonly known as: LIPITOR Take 10 mg by mouth daily.   cetirizine 10 MG tablet Commonly known as: ZYRTEC Take 10 mg by mouth daily.   enoxaparin 40 MG/0.4ML injection Commonly known as: LOVENOX Inject 0.4 mLs (40 mg total) into the skin daily for 14 days.   FLUoxetine 40 MG capsule Commonly known as: PROZAC Take 80 mg by  mouth daily.   hydrochlorothiazide 50 MG tablet Commonly known as: HYDRODIURIL Take 1 tablet (50 mg total) by mouth daily.   KRILL OIL PO Take 1 capsule by mouth daily.   levonorgestrel 20 MCG/24HR IUD Commonly known as: MIRENA 1 each by Intrauterine route once.   lisinopril 10 MG tablet Commonly known as: ZESTRIL Take 10 mg by mouth daily.   Magnesium 250 MG Tabs Take 250 mg by mouth in the morning and at bedtime.   metFORMIN 500 MG 24 hr tablet Commonly known as: GLUCOPHAGE-XR Take 1,000 mg by mouth 2 (two) times daily.   methocarbamol 500 MG tablet Commonly known as: ROBAXIN Take 1 tablet (500 mg total) by mouth every 6 (six) hours as needed for muscle spasms.   oxyCODONE 5 MG immediate release tablet Commonly known as: Oxy IR/ROXICODONE Take 1-2 tablets (5-10 mg total) by mouth every 4 (four) hours as needed for moderate pain (pain score 4-6).   traMADol 50 MG tablet Commonly known as: ULTRAM Take 1 tablet (50 mg total) by mouth every 6 (six) hours as needed.   triamcinolone cream 0.5 % Commonly known as: KENALOG Apply 1 application topically 2 (two) times daily as needed (eczema).   Vitamin D (Cholecalciferol) 25 MCG (1000 UT) Tabs Take 1,000 Units by mouth in the morning and at bedtime.               Durable Medical Equipment  (From admission, onward)           Start     Ordered   03/18/21 1340  DME Walker rolling  Once       Comments: Heavy-duty  Question Answer Comment  Walker: With 5 Inch Wheels   Patient needs a walker to treat with the following condition Status post total hip replacement, left      03/18/21 1339   03/18/21 1340  DME 3 n 1  Once        03/18/21 1339   03/18/21 1340  DME Bedside commode  Once       Comments: Heavy-duty  Question:  Patient needs a bedside commode to treat with the following condition  Answer:  Status post total hip replacement, left   03/18/21 1339            Diagnostic Studies: DG HIP OPERATIVE  UNILAT W OR W/O PELVIS LEFT  Result Date: 03/18/2021 CLINICAL DATA:  Left hip arthroplasty EXAM: OPERATIVE LEFT HIP (WITH PELVIS IF PERFORMED) 1 VIEWS TECHNIQUE: Fluoroscopic spot image(s) were submitted for interpretation post-operatively. COMPARISON:  None. FINDINGS: Thirty-four fluoroscopic exposures are obtained during the performance of the procedure and are provided for interpretation only. Images demonstrate placement of a  left hip arthroplasty in the expected position without signs of acute complication. Please refer to the operative report. FLUOROSCOPY TIME:  0.3 minutes IMPRESSION: 1. Left hip arthroplasty as above. Please refer to the operative report. Electronically Signed   By: Sharlet Salina M.D.   On: 03/18/2021 15:01   DG HIP UNILAT W OR W/O PELVIS 2-3 VIEWS LEFT  Result Date: 03/18/2021 CLINICAL DATA:  Left hip arthroplasty EXAM: DG HIP (WITH OR WITHOUT PELVIS) 2-3V LEFT COMPARISON:  05/01/2015 FINDINGS: Interval postsurgical changes from left total hip arthroplasty. Arthroplasty components are in their expected alignment. No periprosthetic fracture or evidence of other complication. Expected postoperative changes within the overlying soft tissues. IMPRESSION: Interval postsurgical changes from left total hip arthroplasty. Electronically Signed   By: Duanne Guess D.O.   On: 03/18/2021 13:29    Disposition: Patient has cleared all goals with PT, plan for discharge home this afternoon.   Follow-up Information     Evon Slack, PA-C Follow up in 2 week(s).   Specialties: Orthopedic Surgery, Emergency Medicine Contact information: [address] [place] [address] [phone]                Signed: Meriel Pica PA-C 03/21/2021, 12:47 PM

## 2021-03-20 NOTE — Progress Notes (Signed)
PT Cancellation Note  Patient Details Name: Lindsey Peterson MRN: [id number] DOB: November 26, 1975   Cancelled Treatment:     PT attempt 2 x this AM. Both attempts pt in BR having diarrhea. RN aware. Per RN staff," she has been getting up and down a lot with very minimal assistance. Will return later this date and continue to follow per current POC.     Rushie Chestnut 03/20/2021, 3:03 PM

## 2021-03-20 NOTE — Progress Notes (Signed)
   Subjective: 2 Days Post-Op Procedure(s) (LRB): TOTAL HIP ARTHROPLASTY ANTERIOR APPROACH (Left) APPLICATION OF CELL SAVER (N/A) Patient reports pain as 8 on 0-10 scale and moderate.   Patient is well, and has had no acute complaints or problems Denies any CP, SOB, ABD pain. We will continue therapy today.  Plan is to go Home after hospital stay.  Objective: Vital signs in last 24 hours: Temp:  [98.1 F (36.7 C)-99 F (37.2 C)] 98.1 F (36.7 C) (08/06 0451) Pulse Rate:  [81-102] 89 (08/06 0451) Resp:  [17-19] 19 (08/06 0451) BP: (117-140)/(64-92) 129/68 (08/06 0451) SpO2:  [85 %-100 %] 95 % (08/06 0451)  Intake/Output from previous day: No intake/output data recorded. Intake/Output this shift: No intake/output data recorded.  Recent Labs    03/18/21 1515  HGB 10.2*   Recent Labs    03/18/21 1515  WBC 16.7*  RBC 3.24*  HCT 30.1*  PLT 321   Recent Labs    03/18/21 1515  CREATININE 0.79   No results for input(s): LABPT, INR in the last 72 hours.  EXAM General - Patient is Alert, Appropriate, and Oriented Extremity - Neurovascular intact Sensation intact distally Intact pulses distally Dorsiflexion/Plantar flexion intact No cellulitis present Compartment soft Dressing - dressing C/D/I and no drainage Praveena intact without drainage Motor Function - intact, moving foot and toes well on exam.   Past Medical History:  Diagnosis Date   Anxiety    Diabetes mellitus without complication (HCC)    Dysrhythmia    Hypertension    Obesity     Assessment/Plan:   2 Days Post-Op Procedure(s) (LRB): TOTAL HIP ARTHROPLASTY ANTERIOR APPROACH (Left) APPLICATION OF CELL SAVER (N/A) Active Problems:   Status post total hip replacement, left  Estimated body mass index is 49.52 kg/m as calculated from the following:   Height as of this encounter: 5\' 7"  (1.702 m).   Weight as of 03/08/21: 143.4 kg. Advance diet Up with therapy +bowel movement Vital signs  stable Pain well controlled. Care management to consist with discharge to home with home health PT Sunday   DVT Prophylaxis - Lovenox, Foot Pumps, and TED hose Weight-Bearing as tolerated to left leg   T. Tuesday, PA-C [facility] 03/20/2021, 7:24 AM

## 2021-03-20 NOTE — Progress Notes (Signed)
Patient reports she does not take prozac, she takes 80 mg Paxil. Poggi, MD notified. Received verbal orders for 80 mg of Paxil QD placed

## 2021-03-21 LAB — GLUCOSE, CAPILLARY
Glucose-Capillary: 128 mg/dL — ABNORMAL HIGH (ref 70–99)
Glucose-Capillary: 130 mg/dL — ABNORMAL HIGH (ref 70–99)

## 2021-03-21 MED ORDER — METHOCARBAMOL 500 MG PO TABS
750.0000 mg | ORAL_TABLET | Freq: Four times a day (QID) | ORAL | Status: DC | PRN
  Administered 2021-03-21: 750 mg via ORAL
  Filled 2021-03-21: qty 2

## 2021-03-21 MED ORDER — METHOCARBAMOL 1000 MG/10ML IJ SOLN
500.0000 mg | Freq: Four times a day (QID) | INTRAVENOUS | Status: DC | PRN
  Filled 2021-03-21: qty 5

## 2021-03-21 NOTE — TOC Transition Note (Addendum)
Transition of Care [facility]) - CM/SW Discharge Note   Patient Details  Name: [name] MRN: [id number] Date of Birth: 1976-06-11  Transition of Care [facility]) CM/SW Contact:  Liliana Cline, LCSW Phone Number: 03/21/2021, 1:56 PM   Clinical Narrative:   Notified [place] with [facility] of patient discharging home today. Sent secure email to [place] and [place] with [employer], asked them to call patient with update of when her bariatric 3in1 will be delivered to the home.    Final next level of care: Home w Home Health Services Barriers to Discharge: Barriers Resolved   Patient Goals and CMS Choice Patient states their goals for this hospitalization and ongoing recovery are:: home with home health      Discharge Placement                       Discharge Plan and Services                            [facility] Agency: [facility] Date [facility] Agency Contacted: 03/21/21   Representative spoke with at [facility] Agency: [place]  Social Determinants of Health (SDOH) Interventions     Readmission Risk Interventions No flowsheet data found.

## 2021-03-21 NOTE — Progress Notes (Signed)
Physical Therapy Treatment Patient Details Name: Lindsey Peterson MRN: [id number] DOB: [birth year] Today's Date: 03/21/2021    History of Present Illness Pt is a 45 y/o F with PMH: DM, arthritis and HLD who underwent elective anterior L THA on 8/4 Ros[facility] Kea).    PT Comments    OOB to bathroom for about 50' of gait with RW and min guard/supervision.  She is taken to rehab gym via recliner.  Up/down 4 steps with rails and cues.  Pt feels comfortable with her mobility and ability to get in/out of home despite limited ambulation.  Hopes to discharge home today.   Follow Up Recommendations  Home health PT;Supervision for mobility/OOB     Equipment Recommendations  Other (comment) (pt has personal bariatric RW in room. BSC being delivered to home.)    Recommendations for Other Services       Precautions / Restrictions Precautions Precautions: Fall;Anterior Hip Precaution Booklet Issued: Yes (comment) Restrictions Weight Bearing Restrictions: Yes LLE Weight Bearing: Weight bearing as tolerated Other Position/Activity Restrictions: anterior, no "fencing" pose    Mobility  Bed Mobility Overal bed mobility: Needs Assistance Bed Mobility: Supine to Sit     Supine to sit: Min assist          Transfers Overall transfer level: Needs assistance Equipment used: Rolling walker (2 wheeled) Transfers: Sit to/from Stand Sit to Stand: Supervision;From elevated surface            Ambulation/Gait Ambulation/Gait assistance: Supervision Gait Distance (Feet): 50 Feet Assistive device: Rolling walker (2 wheeled) Gait Pattern/deviations: Decreased stance time - left;Step-through pattern;Decreased step length - right;Antalgic Gait velocity: decreased       Stairs Stairs: Yes Stairs assistance: Min guard Stair Management: Two rails;Step to pattern;Forwards Number of Stairs: 4 General stair comments: feels confident with her ability to get into her home   Wheelchair Mobility     Modified Rankin (Stroke Patients Only)       Balance Overall balance assessment: Needs assistance Sitting-balance support: Feet supported Sitting balance-Leahy Scale: Good Sitting balance - Comments: no balance deficits in sitting   Standing balance support: Bilateral upper extremity supported;During functional activity Standing balance-Leahy Scale: Good Standing balance comment: no balance deficits with BUE support                            Cognition Arousal/Alertness: Awake/alert Behavior During Therapy: WFL for tasks assessed/performed Overall Cognitive Status: Within Functional Limits for tasks assessed                                        Exercises      General Comments        Pertinent Vitals/Pain Pain Assessment: 0-10 Pain Score: 4  Pain Location: L hip Pain Descriptors / Indicators: Grimacing;Guarding Pain Intervention(s): Limited activity within patient's tolerance;Monitored during session;Premedicated before session;Repositioned    Home Living                      Prior Function            PT Goals (current goals can now be found in the care plan section) Progress towards PT goals: Progressing toward goals    Frequency    BID      PT Plan Current plan remains appropriate    Co-evaluation  AM-PAC PT "6 Clicks" Mobility   Outcome Measure  Help needed turning from your back to your side while in a flat bed without using bedrails?: A Little Help needed moving from lying on your back to sitting on the side of a flat bed without using bedrails?: A Little Help needed moving to and from a bed to a chair (including a wheelchair)?: A Little Help needed standing up from a chair using your arms (e.g., wheelchair or bedside chair)?: A Little Help needed to walk in hospital room?: A Little Help needed climbing 3-5 steps with a railing? : A Little 6 Click Score: 18    End of Session    Activity Tolerance: Patient tolerated treatment well;Other (comment) (limited by frequent loose BMs) Patient left: in bed;with call bell/phone within reach;with bed alarm set;with family/visitor present Nurse Communication: Mobility status PT Visit Diagnosis: Unsteadiness on feet (R26.81);Other abnormalities of gait and mobility (R26.89);Muscle weakness (generalized) (M62.81);Difficulty in walking, not elsewhere classified (R26.2);Pain Pain - Right/Left: Left Pain - part of body: Hip     Time: 4098-1191 PT Time Calculation (min) (ACUTE ONLY): 29 min  Charges:  $Gait Training: 23-37 mins                    Danielle Dess, PTA 03/21/21, 12:37 PM  03/21/2021, 12:34 PM

## 2021-03-21 NOTE — Progress Notes (Signed)
   Subjective: 3 Days Post-Op Procedure(s) (LRB): TOTAL HIP ARTHROPLASTY ANTERIOR APPROACH (Left) APPLICATION OF CELL SAVER (N/A) Patient reports pain as mild in the left hip but reports muscle spasms.   Patient is well, and has had no acute complaints or problems Denies any CP, SOB, ABD pain. We will continue therapy today.  Plan is to go Home after hospital stay. Patient no longer having any loose BM, C-diff testing negative.  Objective: Vital signs in last 24 hours: Temp:  [98.8 F (37.1 C)-99.1 F (37.3 C)] 99.1 F (37.3 C) (08/07 0735) Pulse Rate:  [82-100] 93 (08/07 0735) Resp:  [16-18] 16 (08/07 0346) BP: (110-136)/(45-69) 114/65 (08/07 0735) SpO2:  [95 %-100 %] 99 % (08/07 0735)  Intake/Output from previous day: 08/06 0701 - 08/07 0700 In: 240 [P.O.:240] Out: 0  Intake/Output this shift: No intake/output data recorded.  Recent Labs    03/18/21 1515  HGB 10.2*   Recent Labs    03/18/21 1515  WBC 16.7*  RBC 3.24*  HCT 30.1*  PLT 321   Recent Labs    03/18/21 1515  CREATININE 0.79   No results for input(s): LABPT, INR in the last 72 hours.  EXAM General - Patient is Alert, Appropriate, and Oriented Extremity - Neurovascular intact Sensation intact distally Intact pulses distally Dorsiflexion/Plantar flexion intact No cellulitis present Compartment soft without any evidence for infection. Dressing - dressing C/D/I and no drainage Praveena intact without drainage Motor Function - intact, moving foot and toes well on exam.   Past Medical History:  Diagnosis Date   Anxiety    Diabetes mellitus without complication (HCC)    Dysrhythmia    Hypertension    Obesity     Assessment/Plan:   3 Days Post-Op Procedure(s) (LRB): TOTAL HIP ARTHROPLASTY ANTERIOR APPROACH (Left) APPLICATION OF CELL SAVER (N/A) Active Problems:   Status post total hip replacement, left  Estimated body mass index is 49.52 kg/m as calculated from the following:   Height  as of this encounter: 5\' 7"  (1.702 m).   Weight as of 03/08/21: 143.4 kg. Advance diet Up with therapy  +bowel movement, C-diff negative. Vital signs stable Pain well controlled.  Incrased muscle relaxer dosage this AM. Up with therapy today, work on navigating stairs. Plan for possible d/c home today pending progress with PT.  DVT Prophylaxis - Lovenox, Foot Pumps, and TED hose Weight-Bearing as tolerated to left leg  J. 03/10/21 PA-C [facility] 03/21/2021, 8:46 AM

## 2021-03-22 LAB — SURGICAL PATHOLOGY

## 2022-03-15 IMAGING — DX DG HIP (WITH OR WITHOUT PELVIS) 2-3V*L*
2 series · 2 of 2 positions shown · non-contrast
Comparison: 05/01/2015

CLINICAL DATA: Left hip arthroplasty

EXAM:
DG HIP (WITH OR WITHOUT PELVIS) 2-3V LEFT

[hip ap]
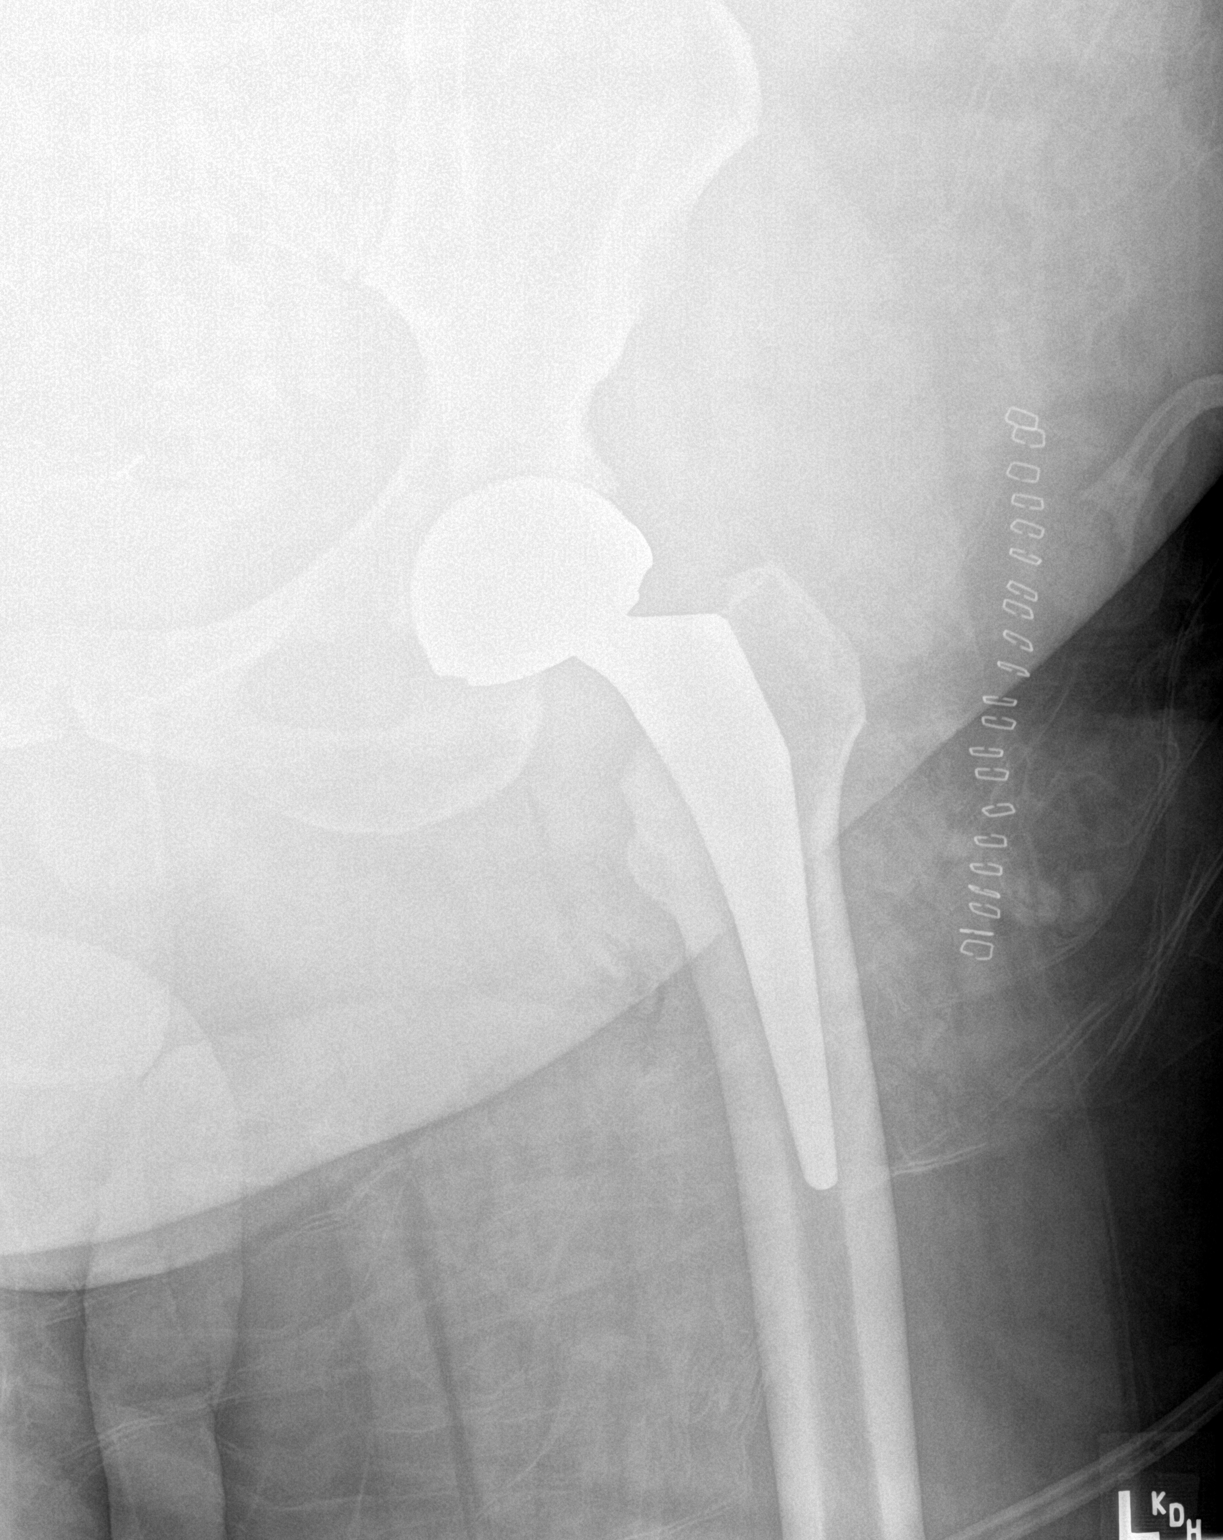

[hip lat]
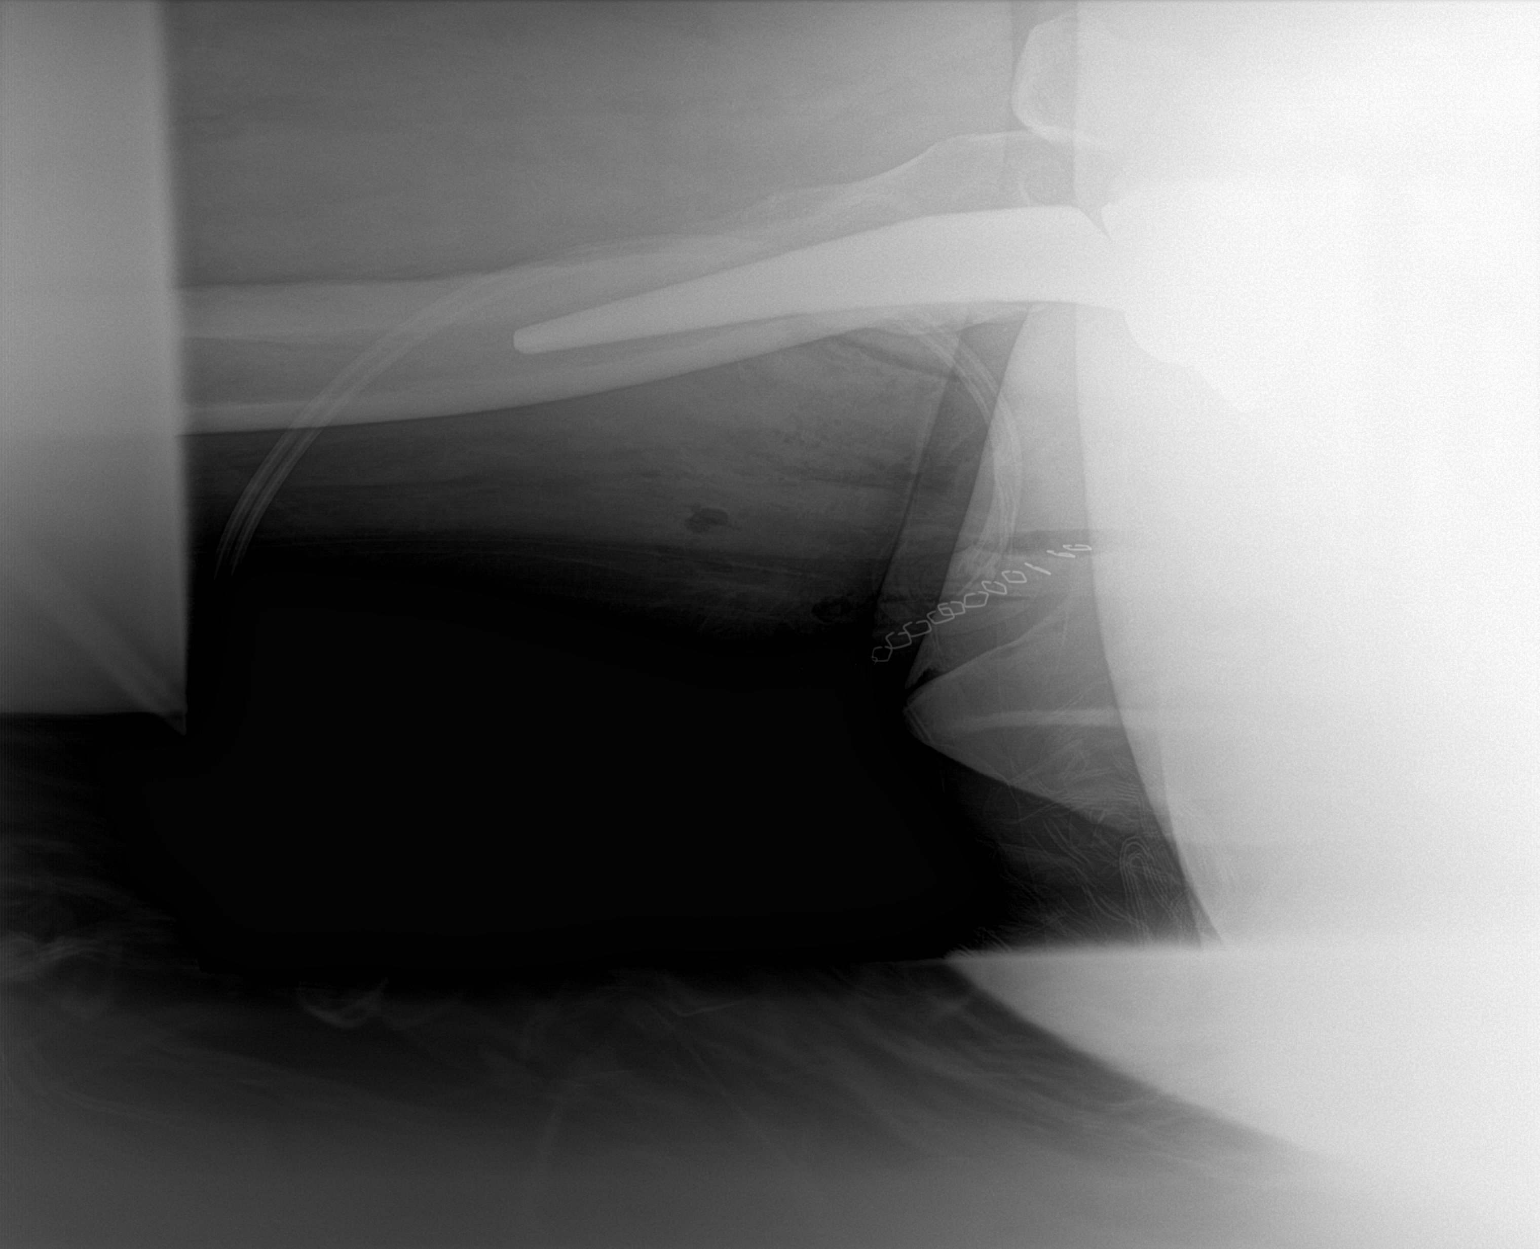

[2 of 2 positions shown; findings below may reference images not displayed]

FINDINGS: Interval postsurgical changes from left total hip arthroplasty.
Arthroplasty components are in their expected alignment. No
periprosthetic fracture or evidence of other complication. Expected
postoperative changes within the overlying soft tissues.
IMPRESSION: Interval postsurgical changes from left total hip arthroplasty.
